# Patient Record
Sex: Male | Born: 1945 | Race: White | Hispanic: No | Smoking: Former smoker
Health system: Southern US, Community
[De-identification: ages and names within clinical notes are randomized; demographics above are authoritative.]

## PROBLEM LIST (undated history)

## (undated) DIAGNOSIS — G20A1 Parkinson's disease without dyskinesia, without mention of fluctuations: Secondary | ICD-10-CM

## (undated) DIAGNOSIS — K5790 Diverticulosis of intestine, part unspecified, without perforation or abscess without bleeding: Secondary | ICD-10-CM

## (undated) DIAGNOSIS — C61 Malignant neoplasm of prostate: Secondary | ICD-10-CM

## (undated) DIAGNOSIS — Z860100 Personal history of colon polyps, unspecified: Secondary | ICD-10-CM

## (undated) DIAGNOSIS — S83206A Unspecified tear of unspecified meniscus, current injury, right knee, initial encounter: Secondary | ICD-10-CM

## (undated) DIAGNOSIS — Z9989 Dependence on other enabling machines and devices: Secondary | ICD-10-CM

## (undated) DIAGNOSIS — E039 Hypothyroidism, unspecified: Secondary | ICD-10-CM

## (undated) DIAGNOSIS — M179 Osteoarthritis of knee, unspecified: Secondary | ICD-10-CM

## (undated) DIAGNOSIS — F4310 Post-traumatic stress disorder, unspecified: Secondary | ICD-10-CM

## (undated) DIAGNOSIS — Z22322 Carrier or suspected carrier of Methicillin resistant Staphylococcus aureus: Secondary | ICD-10-CM

## (undated) DIAGNOSIS — F32A Depression, unspecified: Secondary | ICD-10-CM

## (undated) DIAGNOSIS — G4733 Obstructive sleep apnea (adult) (pediatric): Secondary | ICD-10-CM

## (undated) DIAGNOSIS — F419 Anxiety disorder, unspecified: Secondary | ICD-10-CM

## (undated) DIAGNOSIS — F329 Major depressive disorder, single episode, unspecified: Secondary | ICD-10-CM

## (undated) DIAGNOSIS — H40003 Preglaucoma, unspecified, bilateral: Secondary | ICD-10-CM

## (undated) DIAGNOSIS — J45909 Unspecified asthma, uncomplicated: Secondary | ICD-10-CM

## (undated) DIAGNOSIS — I10 Essential (primary) hypertension: Secondary | ICD-10-CM

## (undated) DIAGNOSIS — M1710 Unilateral primary osteoarthritis, unspecified knee: Secondary | ICD-10-CM

## (undated) DIAGNOSIS — M1990 Unspecified osteoarthritis, unspecified site: Secondary | ICD-10-CM

## (undated) DIAGNOSIS — Z923 Personal history of irradiation: Secondary | ICD-10-CM

## (undated) DIAGNOSIS — G5793 Unspecified mononeuropathy of bilateral lower limbs: Secondary | ICD-10-CM

## (undated) HISTORY — PX: KNEE ARTHROSCOPY: SUR90

## (undated) HISTORY — DX: Major depressive disorder, single episode, unspecified: F32.9

## (undated) HISTORY — PX: COLONOSCOPY W/ POLYPECTOMY: SHX1380

## (undated) HISTORY — DX: Anxiety disorder, unspecified: F41.9

## (undated) HISTORY — DX: Essential (primary) hypertension: I10

## (undated) HISTORY — DX: Malignant neoplasm of prostate: C61

## (undated) HISTORY — DX: Depression, unspecified: F32.A

## (undated) HISTORY — DX: Unspecified asthma, uncomplicated: J45.909

## (undated) HISTORY — DX: Diverticulosis of intestine, part unspecified, without perforation or abscess without bleeding: K57.90

---

## 1974-10-02 HISTORY — PX: MANDIBLE RECONSTRUCTION: SHX431

## 1998-03-18 ENCOUNTER — Ambulatory Visit (HOSPITAL_COMMUNITY): Admission: RE | Admit: 1998-03-18 | Discharge: 1998-03-18 | Payer: Self-pay | Admitting: Urology

## 1998-03-24 ENCOUNTER — Ambulatory Visit (HOSPITAL_COMMUNITY): Admission: RE | Admit: 1998-03-24 | Discharge: 1998-03-24 | Payer: Self-pay | Admitting: Urology

## 1998-04-22 ENCOUNTER — Ambulatory Visit (HOSPITAL_COMMUNITY): Admission: RE | Admit: 1998-04-22 | Discharge: 1998-04-22 | Payer: Self-pay | Admitting: Urology

## 1998-07-30 ENCOUNTER — Other Ambulatory Visit: Admission: RE | Admit: 1998-07-30 | Discharge: 1998-07-30 | Payer: Self-pay | Admitting: Urology

## 1999-10-13 ENCOUNTER — Other Ambulatory Visit: Admission: RE | Admit: 1999-10-13 | Discharge: 1999-10-13 | Payer: Self-pay | Admitting: Internal Medicine

## 1999-10-13 ENCOUNTER — Encounter (INDEPENDENT_AMBULATORY_CARE_PROVIDER_SITE_OTHER): Payer: Self-pay | Admitting: Specialist

## 1999-12-09 ENCOUNTER — Ambulatory Visit: Admission: RE | Admit: 1999-12-09 | Discharge: 1999-12-09 | Payer: Self-pay | Admitting: Pulmonary Disease

## 2000-10-25 ENCOUNTER — Other Ambulatory Visit: Admission: RE | Admit: 2000-10-25 | Discharge: 2000-10-25 | Payer: Self-pay | Admitting: Otolaryngology

## 2000-10-25 ENCOUNTER — Encounter (INDEPENDENT_AMBULATORY_CARE_PROVIDER_SITE_OTHER): Payer: Self-pay | Admitting: Specialist

## 2001-03-08 ENCOUNTER — Ambulatory Visit (HOSPITAL_BASED_OUTPATIENT_CLINIC_OR_DEPARTMENT_OTHER): Admission: RE | Admit: 2001-03-08 | Discharge: 2001-03-08 | Payer: Self-pay | Admitting: Internal Medicine

## 2004-06-03 ENCOUNTER — Ambulatory Visit (HOSPITAL_COMMUNITY): Admission: RE | Admit: 2004-06-03 | Discharge: 2004-06-03 | Payer: Self-pay | Admitting: Orthopedic Surgery

## 2004-06-03 ENCOUNTER — Ambulatory Visit (HOSPITAL_BASED_OUTPATIENT_CLINIC_OR_DEPARTMENT_OTHER): Admission: RE | Admit: 2004-06-03 | Discharge: 2004-06-03 | Payer: Self-pay | Admitting: Orthopedic Surgery

## 2005-09-28 ENCOUNTER — Ambulatory Visit: Payer: Self-pay | Admitting: Internal Medicine

## 2005-10-13 ENCOUNTER — Ambulatory Visit: Payer: Self-pay | Admitting: Internal Medicine

## 2005-11-09 ENCOUNTER — Ambulatory Visit: Payer: Self-pay | Admitting: Internal Medicine

## 2005-11-15 ENCOUNTER — Ambulatory Visit: Payer: Self-pay | Admitting: Internal Medicine

## 2005-12-18 ENCOUNTER — Ambulatory Visit: Payer: Self-pay | Admitting: Internal Medicine

## 2006-07-02 ENCOUNTER — Ambulatory Visit (HOSPITAL_BASED_OUTPATIENT_CLINIC_OR_DEPARTMENT_OTHER): Admission: RE | Admit: 2006-07-02 | Discharge: 2006-07-02 | Payer: Self-pay | Admitting: Orthopedic Surgery

## 2006-07-13 ENCOUNTER — Ambulatory Visit: Payer: Self-pay | Admitting: Internal Medicine

## 2006-11-12 ENCOUNTER — Inpatient Hospital Stay (HOSPITAL_COMMUNITY): Admission: RE | Admit: 2006-11-12 | Discharge: 2006-11-15 | Payer: Self-pay | Admitting: Orthopedic Surgery

## 2006-11-12 HISTORY — PX: TOTAL KNEE ARTHROPLASTY: SHX125

## 2007-02-05 ENCOUNTER — Ambulatory Visit (HOSPITAL_BASED_OUTPATIENT_CLINIC_OR_DEPARTMENT_OTHER): Admission: RE | Admit: 2007-02-05 | Discharge: 2007-02-05 | Payer: Self-pay | Admitting: Orthopedic Surgery

## 2007-03-01 ENCOUNTER — Ambulatory Visit: Payer: Self-pay | Admitting: Internal Medicine

## 2007-03-19 ENCOUNTER — Ambulatory Visit: Payer: Self-pay | Admitting: Internal Medicine

## 2007-03-19 ENCOUNTER — Encounter: Payer: Self-pay | Admitting: Internal Medicine

## 2007-05-28 ENCOUNTER — Encounter: Payer: Self-pay | Admitting: Internal Medicine

## 2007-05-28 DIAGNOSIS — J449 Chronic obstructive pulmonary disease, unspecified: Secondary | ICD-10-CM | POA: Insufficient documentation

## 2007-05-28 DIAGNOSIS — I10 Essential (primary) hypertension: Secondary | ICD-10-CM | POA: Insufficient documentation

## 2007-05-28 DIAGNOSIS — J4489 Other specified chronic obstructive pulmonary disease: Secondary | ICD-10-CM | POA: Insufficient documentation

## 2007-10-03 HISTORY — PX: HAND SURGERY: SHX662

## 2010-07-29 ENCOUNTER — Ambulatory Visit: Payer: Self-pay | Admitting: Emergency Medicine

## 2010-07-29 ENCOUNTER — Encounter: Payer: Self-pay | Admitting: Emergency Medicine

## 2010-07-29 DIAGNOSIS — J45909 Unspecified asthma, uncomplicated: Secondary | ICD-10-CM | POA: Insufficient documentation

## 2010-07-29 DIAGNOSIS — E039 Hypothyroidism, unspecified: Secondary | ICD-10-CM | POA: Insufficient documentation

## 2010-09-16 ENCOUNTER — Ambulatory Visit: Payer: Self-pay | Admitting: Emergency Medicine

## 2010-10-18 ENCOUNTER — Ambulatory Visit: Admit: 2010-10-18 | Payer: Self-pay | Admitting: Emergency Medicine

## 2010-11-01 NOTE — Assessment & Plan Note (Signed)
Summary: COPD   Visit Type:  Initial Consult Copy to:  self referral Primary Provider/Referring Provider:  [facility] [place]  CC:  Dyspnea.  History of Present Illness: 65 yo former smoker, hx of HTN, hypothyroidism, childhood asthma. Has been seen before by PW for dyspnea and presumed COPD. PFT' s done in the 90's. Also w hx allergic rhinitis.   He complains of slow progressive dyspnea, wheeze, congestion in his chest beginning in August. Was well until then. Was seen by Urgent Care and treated for AE/bronchitis with steroids + abx. He didn't feel much relief, was seen back there 4 separate times. Now he has continued DOE, cough is at his usual baseline at this time. Uses albuterol as needed, lately using 2 -3x a day. Not on any scheduled meds, believes he was on combivent, ? other long-acting meds in the past.   Current Medications (verified): 1)  Albuterol 90 Mcg/act  Aers (Albuterol) .... 2 Puffs As Needed 2)  Synthroid 88 Mcg Tabs (Levothyroxine Sodium) .... Take 1 Tablet By Mouth Once A Day  Allergies (verified): No Known Drug Allergies  Past History:  Past Medical History: HYPOTHYROIDISM (ICD-244.9) ASTHMA (ICD-493.90) HYPERTENSION (ICD-401.9) COPD (ICD-496)  Family History: mother - asthma  Social History: Former smoker.  Quit in 1998.  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  The patient denies shortness of breath at rest, productive cough, coughing up blood, chest pain, irregular heartbeats, acid heartburn, indigestion, weight change, abdominal pain, difficulty swallowing, sore throat, tooth/dental problems, headaches, nasal congestion/difficulty breathing through nose, sneezing, itching, ear ache, anxiety, depression, hand/feet swelling, joint stiffness or pain, rash, change in color of mucus, and fever.         Overall wt in last 10  yrs - lost about 15 lbs.   Vital Signs:  Patient profile:   65 year old male Height:      77 inches Weight:      284.50 pounds BMI:     33.86 O2 Sat:      98 % on Room air Temp:     98.3 degrees F oral Pulse rate:   59 / minute BP sitting:   168 / 98  (right arm) Cuff size:   large  Vitals Entered By: Gweneth Dimitri RN (July 29, 2010 1:51 PM)  O2 Flow:  Room air  Serial Vital Signs/Assessments:  Comments: 2:53 PM Ambulatory Pulse Oximetry  Resting; HR_55____    02 Sat__96% RA___  Lap1 (185 feet)   HR_77____   02 Sat_97% RA____ Lap2 (185 feet)   HR_75____   02 Sat__96% RA___    Lap3 (185 feet)   HR_79____   02 Sat_95% RA____  _x__Test Completed without Difficulty ___Test Stopped due to:  Gweneth Dimitri RN  July 29, 2010 2:53 PM  By: Gweneth Dimitri RN   CC: Dyspnea Comments Medications reviewed with patient Daytime contact number verified with patient. Gweneth Dimitri RN  July 29, 2010 1:52 PM    Physical Exam  General:  normal appearance, healthy appearing, and obese.   Head:  normocephalic and atraumatic Eyes:  conjunctiva and sclera clear Nose:  no deformity, discharge, inflammation, or lesions Mouth:  no deformity or lesions Neck:  no masses, thyromegaly, or abnormal cervical nodes Lungs:  clear bilaterally to auscultation on normal respiration, mild wheeze on forced exp Heart:  regular  rate and rhythm, S1, S2 without murmurs, rubs, gallops, or clicks Abdomen:  not examined Msk:  no deformity or scoliosis noted with normal posture Extremities:  no clubbing, cyanosis, edema, or deformity noted Neurologic:  non-focal Skin:  intact without lesions or rashes Psych:  alert and cooperative; normal mood and affect; normal attention span and concentration   Impression & Recommendations:  Problem # 1:  COPD (ICD-496) Presumed COPD presenting w continued SOB after rx for actue exacerbation. - spiro today and get copies of priors (old chart) - walking  oximetry - start spiriva once daily + as needed SABA - ROV to review progress in mid December.   Medications Added to Medication List This Visit: 1)  Synthroid 88 Mcg Tabs (Levothyroxine sodium) .... Take 1 tablet by mouth once a day  Other Orders: Consultation Level IV (45409)  Patient Instructions: 1)  Walking oximetry today showed normal oxygen levels. 2)  Spirometry today showed mild COPD changes 3)  We will start Spiriva once daily until your next visit 4)  Use your albuterol 2 puffs as needed  5)  Follow up with Dr Delton Coombes in Mid-December to report your progress.    Immunization History:  Influenza Immunization History:    Influenza:  historical (07/02/2010)

## 2010-11-01 NOTE — Miscellaneous (Signed)
Summary: Orders Update  Medications Added SPIRIVA HANDIHALER 18 MCG CAPS (TIOTROPIUM BROMIDE MONOHYDRATE) 1 by mouth once daily       Clinical Lists Changes  Medications: Added new medication of SPIRIVA HANDIHALER 18 MCG CAPS (TIOTROPIUM BROMIDE MONOHYDRATE) 1 by mouth once daily - Signed Rx of SPIRIVA HANDIHALER 18 MCG CAPS (TIOTROPIUM BROMIDE MONOHYDRATE) 1 by mouth once daily;  #30 x 2;  Signed;  Entered by: Leslye Peer MD;  Authorized by: Leslye Peer MD;  Method used: Electronically to [facility]  [address] [phone]*, [address] [place], [place], [place]  [address], Ph: [phone] or [phone], Fax: [phone]    Prescriptions: SPIRIVA HANDIHALER 18 MCG CAPS (TIOTROPIUM BROMIDE MONOHYDRATE) 1 by mouth once daily  #30 x 2   Entered and Authorized by:   Leslye Peer MD   Signed by:   Leslye Peer MD on 07/29/2010   Method used:   Electronically to        [facility]  [address] [phone]* (retail)       [address]       [place], [place]  [address]       Ph: [phone] or [phone]       Fax: [phone]   RxID:   [id number]

## 2010-11-17 ENCOUNTER — Ambulatory Visit: Payer: Self-pay | Admitting: Emergency Medicine

## 2011-02-17 NOTE — Op Note (Signed)
NAME:  Jerry Mathews, WAH NO.:  [id number]   MEDICAL RECORD NO.:  [id number]          PATIENT TYPE:  AMB   LOCATION:  DSC                          FACILITY:  [facility]   PHYSICIAN:  Feliberto Gottron. Turner Daniels, M.D.   DATE OF BIRTH:  04/22/1946   DATE OF PROCEDURE:  02/05/2007  DATE OF DISCHARGE:                               OPERATIVE REPORT   PREOPERATIVE DIAGNOSIS:  Arthrofibrosis left total knee.   POSTOPERATIVE DIAGNOSIS:  Arthrofibrosis left total knee.   PROCEDURE:  Closed manipulation left total knee, getting flexion from 85  degrees to almost 120.   SURGEON:  Feliberto Gottron. Turner Daniels, M.D.   FIRST ASSISTANT:  None.   ANESTHETIC:  General mask.   ESTIMATED BLOOD LOSS:  None.   FLUID REPLACEMENT:  500 cc crystalloid.   We also injected the knee with 10 cc of 0.5% Marcaine with epinephrine  solution just before we started the manipulation.   INDICATIONS FOR PROCEDURE:  This 65 year old man underwent a right total  knee arthroplasty 2 months ago.  He has worked hard in physical therapy,  but cannot flex past about 80-85 degrees and desires elective closed  manipulation of the knee to work on motion.  Risks and benefits of  surgery were discussed.  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 Risks and  benefits of surgery were discussed.  Questions answered.   DESCRIPTION OF PROCEDURE:  The patient was identified by armband and  taken the operating room at [facility].  Appropriate  anesthetic monitors were attached, and general mask anesthesia induced  with the patient in the gurney. After this had been accomplished, I went  and sterilely prepped his knee and, using a medial parapatellar  approach, injected him with  10 cc of 0.5% percent Marcaine and  epinephrine solution, and then simply flexed the hip and the knee so the  gravity would help flex the knee for the first 30 seconds or so. This  got him to  about 85 or 90 degrees, and then flexing the hip and the knee  and applying a downward pressure at the middle of the tibia, and  supporting posterior to the knee to prevent any fractures, I went ahead  and gently applied pressure over a 3-minute period and got him to 120  degrees of flexion and made a photographic documentation of this.  Several  small pops or cracks were heard as the scar tissue at loose.  Satisfied  with the closed manipulation, I then brought him to full extension.  There was no effusion noted in the immediate postoperative period.  An  ice pack was applied to the knee.  The patient was then awakened and  taken to the recovery room without difficulty.      Feliberto Gottron. Turner Daniels, M.D.  Electronically Signed     FJR/MEDQ  D:  02/05/2007  T:  02/05/2007  Job:  [id number]

## 2011-02-17 NOTE — Op Note (Signed)
NAME:  [name], Jerry Mathews NO.:  [id number]   MEDICAL RECORD NO.:  [id number]          PATIENT TYPE:  AMB   LOCATION:  DSC                          FACILITY:  [facility]   PHYSICIAN:  Feliberto Gottron. Turner Daniels, M.D.   DATE OF BIRTH:  November 03, 1945   DATE OF PROCEDURE:  07/12/2006  DATE OF DISCHARGE:  07/02/2006                                 OPERATIVE REPORT   PREOPERATIVE DIAGNOSES:  1. Left knee lateral meniscal tear.  2. 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   POSTOPERATIVE DIAGNOSES:  1. Left knee lateral meniscal tear.  2. Chondromalacia of the lateral femoral condyle, global grade 3, focal      grade 4; lateral tibial condyle, focal grade 4, global grade 3 with      flap tears.   PROCEDURES:  1. Left knee arthroscopic lateral meniscectomy.  2. Debridement of chondromalacia from the lateral compartment as well as      the trochlear compartment, where there was also grade 3 chondromalacia      with flap tears.   SURGEON:  Feliberto Gottron. Turner Daniels, M.D.   FIRST ASSISTANT:  None.   ANESTHETIC:  General LMA.   ESTIMATED BLOOD LOSS:  Minimal.   FLUID REPLACEMENT:  800 mL crystalloid.   DRAINS PLACED:  None.   TOURNIQUET TIME:  None.   INDICATIONS FOR PROCEDURE:  The patient is a 65 year old gentleman who has  been treated by me in the past under Workers' Compensation for cartilage  tears and chondromalacia of his left knee.  His symptoms have recently  gotten much worse.  He has failed conservative treatment with a cortisone  injection and desires elective arthroscopic evaluation and treatment of his  left knee.  Risks and benefits of surgery are well-known to the patient.   DESCRIPTION OF PROCEDURE:  The patient identified by armband, taken the  operating room at [facility].  Appropriate anesthetic monitors  were attached and general LMA anesthesia induced with the patient in supine  position.  A lateral post was applied to the table  and the left lower  extremity prepped and draped in the usual sterile fashion from the ankle to  the midthigh.  Using a #11 blade, standard inferomedial and inferolateral  peripatellar portals were then made allowing introduction of the arthroscope  through the inferolateral portal and the outflow through the inferomedial  portal.  Diagnostic arthroscopy revealed grade 3 chondromalacia of the  trochlea, which was debrided back to stable margin with a 3.5 gator sucker  shaver, and there was also some incidental chondromalacia of the patella  noted and this was debrided back to a stable margin as well.  The patient  had complex tearing of the lateral meniscus requiring pretty much a complete  lateral meniscectomy, leaving behind maybe 20% of the rim.  Regarding the  articular cartilage of the lateral tibial plateau, there was a fairly large  area of grade 4 bare bone changes with peripheral grade 3 chondromalacia,  less so on the femoral side.  These areas were debrided back to a  stable  margin.  The gutters were cleared medially and laterally and the knee  irrigated out with normal saline solution.  At this point the arthroscopic  instruments were removed and a dressing of Xeroform, 4x4 dressing sponges,  Webril and Ace wrap applied.  The patient was then awakened and taken to the  recovery room without difficulty.      Feliberto Gottron. Turner Daniels, M.D.  Electronically Signed     FJR/MEDQ  D:  07/12/2006  T:  07/14/2006  Job:  [id number]

## 2011-02-17 NOTE — Op Note (Signed)
NAME:  Jerry Mathews, Jerry Mathews                          ACCOUNT NO.:  [id number]   MEDICAL RECORD NO.:  [id number]                   PATIENT TYPE:  AMB   LOCATION:  DSC                                  FACILITY:  [facility]   PHYSICIAN:  Feliberto Gottron. Turner Daniels, M.D.                DATE OF BIRTH:  [birth year]   DATE OF PROCEDURE:  DATE OF DISCHARGE:                                 OPERATIVE REPORT   DATE OF SURGERY:  June 03, 2004.   PREOPERATIVE DIAGNOSES:  Left knee medial and lateral meniscal tears as well  as ACL tear.   POSTOPERATIVE DIAGNOSES:  Left knee posterior horn medial meniscus tear,  fairly global lateral meniscal tear, partial ACL tear, chondromalacia  trochlea, grade 3, with flap tears, chondromalacia medial femoral condyle  with flap tears, and chondromalacia of the lateral tibial plateau, focal  grade 4, global grade 3, with flap tears.   PROCEDURE:  Left knee arthroscopic debridement of chondromalacia and  meniscal tears as well as partial ACL tear of the lateral bundle.   SURGEON:  Feliberto Gottron. Turner Daniels, MD.   FIRST ASSISTANT:  None.   ANESTHESIA:  General LMA.   ESTIMATED BLOOD LOSS:  Minimal.   FLUID REPLACEMENT:  1400 mL of crystalloid.   DRAINS PLACED:  None.   TOURNIQUET TIME:  None.   INDICATIONS FOR PROCEDURE:  A 65 year old man injured at work with MRI-  proven meniscal tears and what looks like at least a partial ACL tear.  Because of this, he is prepared for surgical intervention for catching,  popping, and pain.  He has had previous arthroscopic surgery in that knee  that has benefited him in the past.   DESCRIPTION OF PROCEDURE:  Patient identified by arm band, taken to the  operating room at [facility], appropriate anesthetic monitors  were attached, and general endotracheal anesthesia induced with the patient  in the supine position.  A lateral post applied to the table, and the left  lower extremity prepped and draped in the usual sterile fashion  from the  ankle to the mid thigh.  Using a #11 blade, standard inferomedial and  inferolateral peripatellar portals were then made allowing introduction of  the arthroscope through the inferolateral portal and the outflow through the  inferomedial portal.  Mild chondromalacia, grade 2, of the patella was  noted, grade 3 chondromalacia with flap tears of the trochlea, grade 1 x 1.5-  cm area was noted and treated with a 3.5 gator sucker shaver.  Moving into  the medial compartment, we encountered a posterior horn horizontal split  tear and parrot-beak tears of the posterior horn of the medial meniscus, and  these were debrided back to stable margins with a 3.5 gator sucker shaver as  well as with the straight biter.  Moving into the notch, the distal lateral  bundle of the ACL insertion was noted to be  torn and freely moving in the  notch.  This was debrided back to a stable margin with 3.5 and 4.2 sucker  shavers.  The lateral meniscus had multiple complex tears requiring  debridement with straight biters as well as a 4.2 great white sucker shaver  and a 3.5 gator sucker shaver.  The posterior aspect of the lateral tibial  plateau had bare bone over a 1 x 1.5-cm area with flap tears that was also  debrided.  The gutters were cleared.  The scope was taken medial and lateral  to the PCL posterior compartment and some chondromalacia of the medial  femoral condyle distally, global grade 3, was also debrided.  At this point,  the knee was irrigated out with normal saline solution, the arthroscopic  instruments removed, local anesthetic of half-percent Marcaine and  epinephrine solution was then infiltrated into the portals, and during the  procedure a supplemental farther medial portal was made for debridement of  the posterior horn of the medial meniscus.  A dressing of Xeroform, 4x4  dressing, sponges, Webril, and an Ace wrap applied.  The total amount of  local anesthetic used was about 20 mL  of half-percent Marcaine with  epinephrine solution.  The patient was then awakened and taken to the  recovery room without difficulty.                                               Feliberto Gottron. Turner Daniels, M.D.    Ovid Curd  D:  06/03/2004  T:  06/05/2004  Job:  [id number]   cc:   Attention, Warden/ranger, Workers [employer]

## 2011-02-17 NOTE — Op Note (Signed)
NAME:  Jerry Mathews, Jerry Mathews NO.:  [id number]   MEDICAL RECORD NO.:  [id number]          PATIENT TYPE:  INP   LOCATION:  2550                         FACILITY:  [facility]   PHYSICIAN:  Feliberto Gottron. Turner Daniels, M.D.   DATE OF BIRTH:  Aug 04, 1946   DATE OF PROCEDURE:  11/12/2006  DATE OF DISCHARGE:                               OPERATIVE REPORT   PREOPERATIVE DIAGNOSIS:  End stage arthritis left knee.   POSTOPERATIVE DIAGNOSIS:  End stage arthritis left knee.   PROCEDURE:  Left total knee arthroplasty using DePuy Sigma RP  components, all cemented, 6 femur, 6 tibia, 10 Sigma RP spacer 41 mm  button, double batch of DePuy HV cement with 1500 mg of Zinacef.   SURGEON:  Feliberto Gottron. Turner Daniels, M.D.   FIRST ASSISTANT:  Skip Mayer, [facility].   ANESTHETIC:  General endotracheal.   ESTIMATED BLOOD LOSS:  Minimal.   FLUID REPLACEMENT:  Liter of crystalloid.   TOURNIQUET TIME:  1 hour 45 minutes.   INDICATIONS FOR PROCEDURE:  A 65 year old gentleman with end-stage  arthritis of the left knee documented at arthroscopy who has failed  conservative treatment with arthroscopy anti-inflammatory medicines,  physical therapy, cortisone injections.  Has bare bone changes by  arthroscopy and now desires elective left total knee arthroplasty.  Risks and benefits of surgery discussed, questions answered.  His pain  is bad now where it affects his ability  to do his job and he is overall  miserable.   DESCRIPTION OF PROCEDURE:  The patient identified by arm band and taken  to the operating room at [facility] where appropriate site  monitors were attached and general endotracheal anesthesia induced with  the patient in supine position.  A tourniquet was then applied high to  the left thigh, lateral post and foot positioner applied to the table  and the patient received 1 gram of Ancef preoperatively.  The left lower  extremity was then prepped and draped in usual sterile fashion from the  ankle  to the mid thigh.  The limb wrapped with an Esmarch bandage with  the knee flexed.  Tourniquet inflated to 350 mmHg and we began the  procedure by making an anterior midline incision allowing a medial  parapatellar arthrotomy approach to the knee.  Small bleeders were  identified and cauterized with Bovie.  After the parapatellar arthrotomy  was accomplished, the patella was everted and the prepatellar fat pad  resected.  The superficial medial collateral ligament was then elevated  from anterior-posterior leaving the sleeve intact on the tibia.  The  knee was then hyperflexed with the patellae everted exposing fairly  impressive osteophytes, the medial and greater on the lateral side of  the femur and the patella.  These were removed with rongeurs and  osteotomes, especially notch osteophytes.  This allowed resection of the  PCL and the ACL.  We then used the posterior medial Z retractor and  McHale retractor through the notch and a lateral Homan to expose the  proximal tibia which was entered with the DePuy step drill followed by  the intramedullary  rod and a 0 degrees posterior slope cutting guide.  This allowed resection of about a centimeter of bone medially and about  4 to 5 mm of bone laterally where most the cartilage deficiency was.  Satisfied with the proximal tibia resection, we then entered the distal  femur 2 mm anterior to the PCL origin with a step drill followed by  intramedullary cutting guide and a 5 degree left distal femoral cutting  guide set at 11 mm.  This and along the epicondylar axis and then a  distal femoral cut accomplished.  We then measured for a #6 femoral  component using the posterior referencing femoral sizing guide and  placed the pins in neutral rotation.  The 6 chamfer cutting guide was  then brought up hammered into place and the anterior-posterior and  chamfer cuts accomplished followed by the box cut.  The everted patella  was then measured at 26  mm, it was thought to fit a 41 mm button so we  took 11 off of a 26 and set the cutting guide at 15 and performed our  posterior tibial cut, sized for a 41 trial button and drilled the  patella. At this point the knee was once again hyperflexed.  We sized  for a 6 tibial base plate which was pinned into place.  The smoke stack  was applied, the conical reamer was taken down to smoke stack and then  the DeltaFit keel was hammered into place.  We then hammered into place  a 6 left femoral trial, snapped into place a 10 mm Sigma RP trial spacer  reduced the patella and took the knee through a range of motion.  The  patella tract laterally and a lateral release was performed with  electrocautery without difficulty.  We also took out some more  peripheral osteophytes from the lateral side of the patella.  At this  point all the trial components removed.  After first drilling the lugs  for the femur, all bony surfaces were water picked clean and dried with  suction and sponges.  At the back table, a double patch of DePuy HV  cement 1500 mg of Zinacef was mixed and applied to all bony and metallic  meeting surfaces except for the posterior condyles of the femur.  In  order, we then hammered into place a 6 tibial base plate and removed  excess cement, a 6 a left femoral component and removed excess cement,  snapped in the 10 Sigma RP spacer and reduced the knee.  The  parapatellar component was then clamped onto the patella and excess  cement removed and all cement was allowed to cure.  Medium Hemovac  drains placed in the wound.  The knee was taken to range of motion to  confirmed good patellar tracking.  The wound was then irrigated out with  normal saline solution, pulse lavage.  The peripatellar arthrotomy  closed with running #1 Vicryl suture, subcutaneous tissue with 0 and 2-0  undyed Vicryl suture and skin with skin staples.  A dressing Xeroform 4x4 dressing sponges, Webril and Ace wrap  applied, then the tourniquet  was let down.  The patient was awakened and taken to recovery room  without difficulty.      Feliberto Gottron. Turner Daniels, M.D.  Electronically Signed     FJR/MEDQ  D:  11/12/2006  T:  11/12/2006  Job:  [id number]

## 2011-02-17 NOTE — Discharge Summary (Signed)
NAME:  Jerry Mathews, DAMIANO NO.:  [id number]   MEDICAL RECORD NO.:  [id number]          PATIENT TYPE:  INP   LOCATION:  5014                         FACILITY:  [facility]   PHYSICIAN:  Feliberto Gottron. Turner Daniels, M.D.   DATE OF BIRTH:  1945/12/16   DATE OF ADMISSION:  11/12/2006  DATE OF DISCHARGE:  11/15/2006                               DISCHARGE SUMMARY   PRIMARY DIAGNOSIS:  End-stage degenerative joint disease of the left  knee.   PROCEDURE WHILE IN HOSPITAL:  Left total knee arthroplasty.   HISTORY OF PRESENT ILLNESS:  Patient is a 65 year old gentleman with end-  stage arthritis of the left knee documented by arthroscopy who has  failed conservative treatment post arthroscopy, including  antiinflammatory medication and physical therapy, cortisone injections  because of the bone-on-bone change by arthroscopy and the continued pain  that is affecting activities of daily living and sleeping.  Now desires  elective left total knee arthroplasty.  Risks and benefits are  discussed.  Questions are answered and wishes to proceed.   ALLERGIES:  No known drug allergies.   MEDICATIONS AT TIME OF ADMISSION:  1. Atacand.  2. Pravastatin.  3. Albuterol.   PAST MEDICAL HISTORY:  Childhood disease.   ADULT HISTORY:  1. Hypertension.  2. Asthma.  3. Elevated cholesterol.   SURGICAL HISTORY:  1. Knees scopes in 2005 and 2007.  2. Jaw surgery in 1976.  3. No difficulties with GET.   SOCIAL HISTORY:  No tobacco.  No IV drug abuse.  Positive ethanol 1-2  beers per month.  He is married.  He is employed as a Naval architect.   FAMILY HISTORY:  Mother died at age 8 with a history of CD, DVT,  hypertension and diabetes.  Father died at age 11 with a history of MI.   REVIEW OF SYSTEMS:  Positive for glasses.  Morning cough and bronchitis.  Denies any chest pain.   PHYSICAL EXAMINATION:  VITAL SIGNS:  Temperature 97.8.  Pulse 64.  Respirations 18.  Blood pressure 128/88.  He is a 6  foot 5 inch 289  pound male.  EXAMINATION OF HEAD:  Shows no trauma.  Pupils equal active to light and  accommodation.  EARS:  TMs are clear.  NOSE:  Is patent.  THROAT:  Benign.  NECK:  Supple.  Full range of motion.  MOUTH:  Does show partial dentures.  CHEST:  Clear to auscultation and percussion.  HEART:  Regular rate and rhythm.  ABDOMEN:  Soft, nontender.  No masses.  Bowel sounds 2+.  EXTREMITIES:  Left knee range of motion with 5-9 degrees with 5+ valgus  deformity.  Well-healed normal scars from post arthroscopy.   X-rays done show bone-on-bone changes of the left knee.   PREOPERATIVE LABS:  Including CBC, CMET, chest x-ray, EKG, PT and PTT  are all within normal limits.  Exception of EKG which did show some left  axis deviation.   HOSPITAL COURSE:  On the day of admission the patient was taken to the  operating room at [facility] where he underwent a total  knee  arthroplasty using diffuse Sigma RP components all submitted #6 femur,  #6 tibia, 10 mm Sigma RP spacer with a 41-mm patellar button, double  batch of DePuy HV cement with 1500 mg of Zinacef embedded.  Medium  Hemovac was placed double-armed into the wound.  Patient was placed  perioperative antibiotics.  He is placed on postoperative Coumadin  prophylaxis with bridging Lovenox therapy.  He is placed on  postoperative PC with Dilaudid for pain control and Foley was placed  perioperatively.  Physical therapy was begun in the recovery room using  a CPM to regain early motion.   On Postop day 1, the patient was awake, alert, in moderate pain.  No  nausea or vomiting.  Taking p.o.'s well.  Vital signs are stable.  Wound  is clean and dry.  Hemoglobin 11.5.  WBC 9.3.  PT 14.7.  Urine output  250 mL per shift.  Hemovac was discontinued without difficulty.  Postoperative day 2 patient was reporting moderate pain.  T-max of 102.2  range and 99.4.  Hemoglobin 11.7.  WBC 10.7.  INR 1.2.  Dressing with  dry blood at  drain site.  Wound is otherwise benign.  Cap was soft and  nontender.  Patient did have some scattered wheezes in his lungs.  X-  rays were taken which showed no acute disease.  Physical therapy  continued.  Postoperative day 3 patient was afebrile.  Vital signs were  stable.  Wound was benign.  Perfusion was improving.  Hemoglobin 10.8.  WBC 9.8.  INR 1.3 and it was hoping that he would be discharged the  following day.  Because of the patient's rapid progress and physical  therapy that day; however, he was discharged home on the third  postoperative day to the care of his family.   DISCHARGE MEDICATIONS:  1. At the time of his discharge medications included Percocet for pain      control.  2. Coumadin per pharmacy protocol with a target INR of 1.52 which      would be monitored by [facility] for the following 2 weeks      postoperatively.  3. Robaxin as needed for spasm.  4. He will restart his home medications including Atacand.  5. Pravastatin.  6. Albuterol.   ACTIVITY:  Weightbearing as tolerated with total knee precautions and  walker.  CMP for home use.  He will have home health nursing, home  health PT through [facility].   DIET:  Regular.   WOUND CARE:  Dressing changes daily or as needed.   FOLLOWUP:  Return to clinic in approximately 1 weeks times.  Sooner if  he should have any temperature, increase in the pain or drainage from  the wound.  At the time of his discharge he was medically stable and  orthopedically improved.   PROCEDURE WHILE IN HOSPITAL:  Left total knee arthroplasty.      Laural Benes. Jannet Mantis.      Feliberto Gottron. Turner Daniels, M.D.  Electronically Signed    JBR/MEDQ  D:  01/14/2007  T:  01/14/2007  Job:  [id number]

## 2011-04-25 ENCOUNTER — Encounter (HOSPITAL_COMMUNITY)
Admission: RE | Admit: 2011-04-25 | Discharge: 2011-04-25 | Disposition: A | Discharge status: Home / Self Care | Payer: Worker's Compensation | Source: Ambulatory Visit | Attending: Orthopedic Surgery | Admitting: Orthopedic Surgery

## 2011-04-25 ENCOUNTER — Ambulatory Visit (HOSPITAL_COMMUNITY)
Admission: RE | Admit: 2011-04-25 | Discharge: 2011-04-25 | Disposition: A | Discharge status: Home / Self Care | Payer: Worker's Compensation | Source: Ambulatory Visit | Attending: Orthopedic Surgery | Admitting: Orthopedic Surgery

## 2011-04-25 ENCOUNTER — Other Ambulatory Visit (HOSPITAL_COMMUNITY): Payer: Self-pay | Admitting: Orthopedic Surgery

## 2011-04-25 DIAGNOSIS — Z01818 Encounter for other preprocedural examination: Secondary | ICD-10-CM | POA: Insufficient documentation

## 2011-04-25 DIAGNOSIS — M25569 Pain in unspecified knee: Secondary | ICD-10-CM

## 2011-04-25 DIAGNOSIS — J4489 Other specified chronic obstructive pulmonary disease: Secondary | ICD-10-CM | POA: Insufficient documentation

## 2011-04-25 DIAGNOSIS — Z01812 Encounter for preprocedural laboratory examination: Secondary | ICD-10-CM | POA: Insufficient documentation

## 2011-04-25 DIAGNOSIS — J449 Chronic obstructive pulmonary disease, unspecified: Secondary | ICD-10-CM | POA: Insufficient documentation

## 2011-04-25 DIAGNOSIS — I517 Cardiomegaly: Secondary | ICD-10-CM | POA: Insufficient documentation

## 2011-04-25 LAB — BASIC METABOLIC PANEL
BUN: 13 mg/dL (ref 6–23)
CO2: 31 mEq/L (ref 19–32)
Calcium: 9.9 mg/dL (ref 8.4–10.5)
Chloride: 103 mEq/L (ref 96–112)
Creatinine, Ser: 0.82 mg/dL (ref 0.50–1.35)
GFR calc Af Amer: >60 mL/min (ref >60)
GFR calc non Af Amer: >60 mL/min (ref >60)
Glucose, Bld: 96 mg/dL (ref 70–99)
Potassium: 4.4 mEq/L (ref 3.5–5.1)
Sodium: 141 mEq/L (ref 135–145)

## 2011-04-25 LAB — TYPE AND SCREEN
ABO/RH(D): AB POS
Antibody Screen: NEGATIVE

## 2011-04-25 LAB — DIFFERENTIAL
Basophils Absolute: 0 10*3/uL (ref 0.0–0.1)
Basophils Relative: 0 % (ref 0–1)
Eosinophils Absolute: 0.1 10*3/uL (ref 0.0–0.7)
Eosinophils Relative: 2 % (ref 0–5)
Lymphocytes Relative: 35 % (ref 12–46)
Lymphs Abs: 1.6 10*3/uL (ref 0.7–4.0)
Monocytes Absolute: 0.6 10*3/uL (ref 0.1–1.0)
Monocytes Relative: 13 % — ABNORMAL HIGH (ref 3–12)
Neutro Abs: 2.3 10*3/uL (ref 1.7–7.7)
Neutrophils Relative %: 49 % (ref 43–77)

## 2011-04-25 LAB — CBC
HCT: 44.6 % (ref 39.0–52.0)
Hemoglobin: 15.7 g/dL (ref 13.0–17.0)
MCH: 31.4 pg (ref 26.0–34.0)
MCHC: 35.2 g/dL (ref 30.0–36.0)
MCV: 89.2 fL (ref 78.0–100.0)
Platelets: 219 10*3/uL (ref 150–400)
RBC: 5 MIL/uL (ref 4.22–5.81)
RDW: 13.2 % (ref 11.5–15.5)
WBC: 4.7 10*3/uL (ref 4.0–10.5)

## 2011-04-25 LAB — URINALYSIS, ROUTINE W REFLEX MICROSCOPIC
Bilirubin Urine: NEGATIVE
Glucose, UA: NEGATIVE mg/dL
Hgb urine dipstick: NEGATIVE
Ketones, ur: NEGATIVE mg/dL
Leukocytes, UA: NEGATIVE
Nitrite: NEGATIVE
Protein, ur: NEGATIVE mg/dL
Specific Gravity, Urine: 1.017 (ref 1.005–1.030)
Urobilinogen, UA: 0.2 mg/dL (ref 0.0–1.0)
pH: 6.5 (ref 5.0–8.0)

## 2011-04-25 LAB — SURGICAL PCR SCREEN
MRSA, PCR: NEGATIVE
Staphylococcus aureus: POSITIVE — AB

## 2011-04-25 LAB — PROTIME-INR
INR: 0.92 (ref 0.00–1.49)
Prothrombin Time: 12.6 seconds (ref 11.6–15.2)

## 2011-04-25 LAB — APTT: aPTT: 30 seconds (ref 24–37)

## 2011-05-01 ENCOUNTER — Inpatient Hospital Stay (HOSPITAL_COMMUNITY)
Admission: RE | Admit: 2011-05-01 | Discharge: 2011-05-03 | DRG: 489 | Disposition: A | Discharge status: Home / Self Care | Payer: Worker's Compensation | Source: Ambulatory Visit | Attending: Orthopedic Surgery | Admitting: Orthopedic Surgery

## 2011-05-01 DIAGNOSIS — Z01818 Encounter for other preprocedural examination: Secondary | ICD-10-CM

## 2011-05-01 DIAGNOSIS — Z96659 Presence of unspecified artificial knee joint: Secondary | ICD-10-CM

## 2011-05-01 DIAGNOSIS — J45909 Unspecified asthma, uncomplicated: Secondary | ICD-10-CM | POA: Diagnosis present

## 2011-05-01 DIAGNOSIS — E039 Hypothyroidism, unspecified: Secondary | ICD-10-CM | POA: Diagnosis present

## 2011-05-01 DIAGNOSIS — Z87891 Personal history of nicotine dependence: Secondary | ICD-10-CM

## 2011-05-01 DIAGNOSIS — Y831 Surgical operation with implant of artificial internal device as the cause of abnormal reaction of the patient, or of later complication, without mention of misadventure at the time of the procedure: Secondary | ICD-10-CM | POA: Diagnosis present

## 2011-05-01 DIAGNOSIS — Z01812 Encounter for preprocedural laboratory examination: Secondary | ICD-10-CM

## 2011-05-01 DIAGNOSIS — T84099A Other mechanical complication of unspecified internal joint prosthesis, initial encounter: Principal | ICD-10-CM | POA: Diagnosis present

## 2011-05-01 DIAGNOSIS — I10 Essential (primary) hypertension: Secondary | ICD-10-CM | POA: Diagnosis present

## 2011-05-01 HISTORY — PX: OTHER SURGICAL HISTORY: SHX169

## 2011-05-02 LAB — PROTIME-INR
INR: 1.09 (ref 0.00–1.49)
Prothrombin Time: 14.3 seconds (ref 11.6–15.2)

## 2011-05-02 LAB — CBC
HCT: 36.1 % — ABNORMAL LOW (ref 39.0–52.0)
Hemoglobin: 12.2 g/dL — ABNORMAL LOW (ref 13.0–17.0)
MCH: 30.7 pg (ref 26.0–34.0)
MCHC: 33.8 g/dL (ref 30.0–36.0)
MCV: 90.9 fL (ref 78.0–100.0)
Platelets: 186 10*3/uL (ref 150–400)
RBC: 3.97 MIL/uL — ABNORMAL LOW (ref 4.22–5.81)
RDW: 12.9 % (ref 11.5–15.5)
WBC: 7 10*3/uL (ref 4.0–10.5)

## 2011-05-02 LAB — BASIC METABOLIC PANEL
BUN: 14 mg/dL (ref 6–23)
CO2: 31 mEq/L (ref 19–32)
Calcium: 8.4 mg/dL (ref 8.4–10.5)
Chloride: 97 mEq/L (ref 96–112)
Creatinine, Ser: 0.73 mg/dL (ref 0.50–1.35)
GFR calc Af Amer: >60 mL/min (ref >60)
GFR calc non Af Amer: >60 mL/min (ref >60)
Glucose, Bld: 154 mg/dL — ABNORMAL HIGH (ref 70–99)
Potassium: 4.3 mEq/L (ref 3.5–5.1)
Sodium: 133 mEq/L — ABNORMAL LOW (ref 135–145)

## 2011-05-03 LAB — CBC
HCT: 37.2 % — ABNORMAL LOW (ref 39.0–52.0)
Hemoglobin: 12.6 g/dL — ABNORMAL LOW (ref 13.0–17.0)
MCH: 30.6 pg (ref 26.0–34.0)
MCHC: 33.9 g/dL (ref 30.0–36.0)
MCV: 90.3 fL (ref 78.0–100.0)
Platelets: 201 10*3/uL (ref 150–400)
RBC: 4.12 MIL/uL — ABNORMAL LOW (ref 4.22–5.81)
RDW: 12.9 % (ref 11.5–15.5)
WBC: 6.5 10*3/uL (ref 4.0–10.5)

## 2011-05-03 LAB — PROTIME-INR
INR: 1.08 (ref 0.00–1.49)
Prothrombin Time: 14.2 seconds (ref 11.6–15.2)

## 2011-05-04 LAB — TISSUE CULTURE: Culture: NO GROWTH

## 2011-05-04 NOTE — Op Note (Signed)
NAME:  DARRAL, RISHEL NO.:  [id number]  MEDICAL RECORD NO.:  [id number]  LOCATION:  2550                         FACILITY:  [facility]  PHYSICIAN:  Feliberto Gottron. Turner Daniels, M.D.   DATE OF BIRTH:  [birth year]  DATE OF PROCEDURE:  05/01/2011 DATE OF DISCHARGE:                              OPERATIVE REPORT   PREOPERATIVE DIAGNOSIS:  Loose patella left total knee.  POSTOPERATIVE DIAGNOSIS:  Loose patella left total knee.  PROCEDURE:  Revision left total knee patella, removal of a 41-mm DePuy patellar button and revision to a new 41-mm patellar button.  SURGEON:  Feliberto Gottron.  Turner Daniels, MD  FIRST ASSISTANT:  Shirl Harris PA-C.  ANESTHETIC:  General endotracheal.  ESTIMATED BLOOD LOSS:  Minimal.  FLUID REPLACEMENT:  59 mL crystalloid.  DRAINS PLACED:  Two medium Hemovacs and a Foley catheter.  URINE OUTPUT:  300 mL.  TOURNIQUET TIME:  One hour and 25 minutes.  INDICATIONS FOR PROCEDURE:  This is a 65 year old gentleman who underwent left total knee arthroplasty a few years ago and started developing parapatellar pain and most recently bloody effusion. Cultures have been negative.  Bone scan was consistent with a loosening of the patellar component and most recent x-rays have shown some movement of the patellar component.  He is taken for revision of same and will also assessed the tibia and the femur at the same time, although they did not symptomatically appeared to be loose.  Risks and benefits of surgery discussed, questions answered.  His pain is severe and debilitating and prevents normal activities.  DESCRIPTION OF PROCEDURE:  The patient identified by armband, received preoperative IV antibiotics in the holding area at [facility] and femoral nerve block, taken to operating room #5, appropriate site monitors were attached, endotracheal anesthesia induced.  The patient in supine position.  Tourniquet applied high to the left thigh, foot positioner and  lateral post also applied to the table.  Left lower extremity was prepped and draped in usual sterile fashion from the ankle to the tourniquet.  Time-out procedure was performed.  Limb wrapped with an Esmarch bandage, tourniquet placed at 350 mmHg, began the operation by recreating the old anterior midline incision through the skin and subcutaneous tissue down to the level of the transverse retinaculum over the patella which was then incised and reflected medially and a thick flap.  We then performed a medial parapatellar arthrotomy and mainly find a bloody fluid.  We worked around the patella and the patellar component was grossly loose and removed simply by breech again and taking it off the bony bed.  We also sent off a thick slice of synovial tissue for Gram stain and culture.  This tissue did not appear to be infected.  We then carefully worked around the scar tissue, performed a fairly radical synovectomy throughout the anterior compartment and we were finally able to evert the patella and flex the knee up to about a 120-130 degrees.  We then removed synovial tissue from around the proximal tibia and the distal femur giving [place] access to those components which did not appear to be loose.  We then used an osteotome and a mallet and  tapped on each component to see if there was any looseness or bubbling of fluid from beneath it and there was none.  These components were well-placed and well-fixed as confirmed by x-ray.  At this point, we addressed the patella and did a 1-2 mm cut with the oscillating saw getting down to a good bony bed narrow still 18 mm of residual thickness to the patella prior to this resection.  Then using curettes we were carefully probed for any loose or damage bone, was quite sclerotic.  We had drilled the bone at multiple places with a 330-second drill bit and then sized for a new 41 button and drilled through the lollipop.  At this point, the wound was  thoroughly irrigated out normal saline solution.  The patella was then carefully dried with suction and sponges, a single batch of DePuy HV cement with 750 mg of Zinacef was mixed at the back table and a new 41-mm button was then cemented onto the patellar bed and held there with a clamp until the cement cured.  We then checked our tracking one more time, it was excellent, required no thumb pressure whatsoever.  Medium Hemovac drains were placed from anterolateral approach.  The parapatellar arthrotomy closed with running #1 Vicryl suture.  The subcutaneous tissue with 0 and 2-0 undyed Vicryl suture and the skin with skin staples.  Dressing of Xeroform 4x4 dressing sponges, Webril and Ace wrap was applied.  The patient was awakened, extubated and taken to the recovery room without difficulty.     Feliberto Gottron. Turner Daniels, M.D.     Ovid Curd  D:  05/01/2011  T:  05/01/2011  Job:  [id number]  Electronically Signed by Gean Birchwood M.D. on 05/04/2011 06:21:41 AM

## 2011-05-06 LAB — ANAEROBIC CULTURE

## 2012-01-23 ENCOUNTER — Encounter: Payer: Self-pay | Admitting: Internal Medicine

## 2012-05-21 ENCOUNTER — Encounter: Payer: Self-pay | Admitting: Internal Medicine

## 2012-05-21 ENCOUNTER — Ambulatory Visit (AMBULATORY_SURGERY_CENTER): Payer: Medicare HMO | Admitting: *Deleted

## 2012-05-21 VITALS — Ht 77.0 in | Wt 268.0 lb

## 2012-05-21 DIAGNOSIS — Z1211 Encounter for screening for malignant neoplasm of colon: Secondary | ICD-10-CM

## 2012-05-21 NOTE — Progress Notes (Signed)
Pt already has MOVI PREP

## 2012-06-04 ENCOUNTER — Ambulatory Visit (AMBULATORY_SURGERY_CENTER): Payer: Medicare HMO | Admitting: Internal Medicine

## 2012-06-04 ENCOUNTER — Encounter: Payer: Self-pay | Admitting: Internal Medicine

## 2012-06-04 VITALS — BP 117/77 | HR 56 | Temp 97.7°F | Resp 18 | Ht 77.0 in | Wt 268.0 lb

## 2012-06-04 DIAGNOSIS — Z86010 Personal history of colonic polyps: Secondary | ICD-10-CM

## 2012-06-04 DIAGNOSIS — Z1211 Encounter for screening for malignant neoplasm of colon: Secondary | ICD-10-CM

## 2012-06-04 MED ORDER — SODIUM CHLORIDE 0.9 % IV SOLN: 500.0000 mL | INTRAVENOUS | Status: DC

## 2012-06-04 NOTE — Op Note (Signed)
[facility] [address] [place], [address]   COLONOSCOPY PROCEDURE REPORT  PATIENT: Jerry, Mathews.  MR#: [id number] BIRTHDATE: [birth year] , 66  yrs. old GENDER: Male ENDOSCOPIST: Roxy Cedar, MD REFERRED ZD:[id number] Program Recall PROCEDURE DATE:  06/04/2012 PROCEDURE:   Colonoscopy, surveillance ASA CLASS:   Class II INDICATIONS:High risk screening; patient's personal history of adenomatous colon polyps (prior exams 2001,04,08). MEDICATIONS: MAC sedation, administered by CRNA and propofol (Diprivan) 230mg  IV  DESCRIPTION OF PROCEDURE:   After the risks benefits and alternatives of the procedure were thoroughly explained, informed consent was obtained.  A digital rectal exam revealed no abnormalities of the rectum.   The LB CF-H180AL [id number]  endoscope was introduced through the anus and advanced to the cecum, which was identified by both the appendix and ileocecal valve. No adverse events experienced.   The quality of the prep was good, using MoviPrep  The instrument was then slowly withdrawn as the colon was fully examined.      COLON FINDINGS: A normal appearing cecum, ileocecal valve, and appendiceal orifice were identified.  The ascending, hepatic flexure, transverse, splenic flexure, descending, sigmoid colon and rectum appeared unremarkable.  No polyps or cancers were seen. Retroflexed views revealed no abnormalities. The time to cecum=4 minutes 29 seconds.  Withdrawal time=10 minutes 05 seconds.  The scope was withdrawn and the procedure completed. COMPLICATIONS: There were no complications.  ENDOSCOPIC IMPRESSION: Normal colon  RECOMMENDATIONS: Follow up colonoscopy in 5 years   eSigned:  Roxy Cedar, MD 06/04/2012 10:55 AM   cc: The Patient

## 2012-06-04 NOTE — Patient Instructions (Addendum)

## 2012-06-04 NOTE — Progress Notes (Signed)
Patient did not have preoperative order for IV antibiotic SSI prophylaxis. (G8918)   

## 2012-06-05 ENCOUNTER — Telehealth: Payer: Self-pay | Admitting: *Deleted

## 2012-06-05 NOTE — Telephone Encounter (Signed)
  Follow up Call-  Call back number 06/04/2012  Post procedure Call Back phone  # [phone]  Permission to leave phone message Yes     Patient questions:  Do you have a fever, pain , or abdominal swelling? no Pain Score  0 *  Have you tolerated food without any problems? yes  Have you been able to return to your normal activities? yes  Do you have any questions about your discharge instructions: Diet   no Medications  no Follow up visit  no  Do you have questions or concerns about your Care? no  Actions: * If pain score is 4 or above: No action needed, pain <4.

## 2012-08-12 ENCOUNTER — Encounter: Payer: Self-pay | Admitting: Internal Medicine

## 2012-09-10 ENCOUNTER — Encounter: Payer: Self-pay | Admitting: Internal Medicine

## 2012-09-10 ENCOUNTER — Ambulatory Visit (INDEPENDENT_AMBULATORY_CARE_PROVIDER_SITE_OTHER): Payer: Medicare HMO | Admitting: Internal Medicine

## 2012-09-10 VITALS — BP 110/70 | HR 76 | Ht 77.0 in | Wt 270.6 lb

## 2012-09-10 DIAGNOSIS — K5900 Constipation, unspecified: Secondary | ICD-10-CM

## 2012-09-10 DIAGNOSIS — Z86010 Personal history of colonic polyps: Secondary | ICD-10-CM

## 2012-09-10 NOTE — Patient Instructions (Addendum)
You may take Miralax daily for constipation.  Adjust as needed to obtain the desired effect

## 2012-09-10 NOTE — Progress Notes (Signed)
HISTORY OF PRESENT ILLNESS:  Jerry Mathews is a 66 y.o. male with a history of hypothyroidism, asthma, anxiety/depression, hypertension, and adenomatous colon polyps. His general medical care is through the [facility] system the patient has undergone prior colonoscopies in 2001, 2004, 2008, and most recently 06/04/2012. The examination at that time was normal. Routine followup in 5 years recommended. He presents today, with his wife, regarding new onset constipation. He reports having had normal bowel movements his entire life. However, shortly after completing his colonoscopy, he developed problems with constipation. Around that time, his Effexor dosage was doubled. He states that thyroid testing the summer was normal. For this problem he has been taking Metamucil and Dulcolax tabs once or twice per week. He was also, recently, diagnosed with prostate cancer about 2 months ago. No therapy to date. His GI review of systems is otherwise negative. No dysphagia or weight loss.  REVIEW OF SYSTEMS:  All non-GI ROS negative   Past Medical History  Diagnosis Date  . Colon polyps     adenomatous  . Diverticulosis   . Hemorrhoids   . Anxiety   . Asthma   . Depression   . Hypertension   . Thyroid disease     hypothyroid  . Prostate cancer     Past Surgical History  Procedure Date  . Total knee arthroplasty     leftx 2  . Knee arthroscopy     left x 1  . Knee surgery     to reattach left knee cap  . Mandible reconstruction 1976  . Hand surgery     left     Social History Jerry Mathews  reports that he has quit smoking. He has never used smokeless tobacco. He reports that he drinks alcohol. He reports that he does not use illicit drugs.  family history is negative for Colon cancer, and Esophageal cancer, and Rectal cancer, and Stomach cancer, .  No Known Allergies     PHYSICAL EXAMINATION: Vital signs: BP 110/70  Pulse 76  Ht 6\' 5"  (1.956 m)  Wt 270 lb 9.6 oz (122.743 kg)   BMI 32.09 kg/m2 General: Well-developed, well-nourished, no acute distress HEENT: Sclerae are anicteric, conjunctiva pink. Oral mucosa intact Lungs: Clear Heart: Regular Abdomen: soft, nontender, nondistended, no obvious ascites, no peritoneal signs, normal bowel sounds. No organomegaly. Extremities: No edema Psychiatric: alert and oriented x3. Cooperative    ASSESSMENT:  #1. Constipation. Functional. Likely related to increased dose of Effexor #2. History of adenomatous colon polyps. Normal surveillance colonoscopy 06/04/2012  PLAN:  #1. Fiber, stool softeners, and increase water #2. If ineffective, MiraLax daily. Titrated to need. #3. Surveillance colonoscopy around September 2018. Interval followup as needed

## 2012-12-23 ENCOUNTER — Encounter (HOSPITAL_COMMUNITY): Payer: Self-pay | Admitting: Pharmacy Technician

## 2012-12-31 ENCOUNTER — Encounter (HOSPITAL_COMMUNITY)
Admission: RE | Admit: 2012-12-31 | Discharge: 2012-12-31 | Disposition: A | Discharge status: Home / Self Care | Payer: Medicare HMO | Source: Ambulatory Visit | Attending: Orthopedic Surgery | Admitting: Orthopedic Surgery

## 2012-12-31 ENCOUNTER — Ambulatory Visit (HOSPITAL_COMMUNITY)
Admission: RE | Admit: 2012-12-31 | Discharge: 2012-12-31 | Disposition: A | Discharge status: Home / Self Care | Payer: Medicare HMO | Source: Ambulatory Visit | Attending: Orthopedic Surgery | Admitting: Orthopedic Surgery

## 2012-12-31 ENCOUNTER — Encounter (HOSPITAL_COMMUNITY): Payer: Self-pay

## 2012-12-31 ENCOUNTER — Other Ambulatory Visit (HOSPITAL_COMMUNITY): Payer: Self-pay | Admitting: *Deleted

## 2012-12-31 DIAGNOSIS — Z87891 Personal history of nicotine dependence: Secondary | ICD-10-CM | POA: Insufficient documentation

## 2012-12-31 DIAGNOSIS — R9431 Abnormal electrocardiogram [ECG] [EKG]: Secondary | ICD-10-CM | POA: Insufficient documentation

## 2012-12-31 DIAGNOSIS — Z01810 Encounter for preprocedural cardiovascular examination: Secondary | ICD-10-CM | POA: Insufficient documentation

## 2012-12-31 DIAGNOSIS — Z01818 Encounter for other preprocedural examination: Secondary | ICD-10-CM | POA: Insufficient documentation

## 2012-12-31 DIAGNOSIS — Z01812 Encounter for preprocedural laboratory examination: Secondary | ICD-10-CM | POA: Insufficient documentation

## 2012-12-31 DIAGNOSIS — I498 Other specified cardiac arrhythmias: Secondary | ICD-10-CM | POA: Insufficient documentation

## 2012-12-31 DIAGNOSIS — I10 Essential (primary) hypertension: Secondary | ICD-10-CM | POA: Insufficient documentation

## 2012-12-31 HISTORY — DX: Unspecified osteoarthritis, unspecified site: M19.90

## 2012-12-31 LAB — BASIC METABOLIC PANEL
BUN: 13 mg/dL (ref 6–23)
CO2: 30 mEq/L (ref 19–32)
Calcium: 9.5 mg/dL (ref 8.4–10.5)
Chloride: 101 mEq/L (ref 96–112)
Creatinine, Ser: 0.74 mg/dL (ref 0.50–1.35)
GFR calc Af Amer: >90 mL/min (ref >90)
GFR calc non Af Amer: >90 mL/min (ref >90)
Glucose, Bld: 87 mg/dL (ref 70–99)
Potassium: 4.2 mEq/L (ref 3.5–5.1)
Sodium: 138 mEq/L (ref 135–145)

## 2012-12-31 LAB — APTT: aPTT: 36 seconds (ref 24–37)

## 2012-12-31 LAB — URINALYSIS, ROUTINE W REFLEX MICROSCOPIC
Bilirubin Urine: NEGATIVE
Glucose, UA: NEGATIVE mg/dL
Hgb urine dipstick: NEGATIVE
Ketones, ur: NEGATIVE mg/dL
Leukocytes, UA: NEGATIVE
Nitrite: NEGATIVE
Protein, ur: NEGATIVE mg/dL
Specific Gravity, Urine: 1.008 (ref 1.005–1.030)
Urobilinogen, UA: 0.2 mg/dL (ref 0.0–1.0)
pH: 7 (ref 5.0–8.0)

## 2012-12-31 LAB — SURGICAL PCR SCREEN
MRSA, PCR: NEGATIVE
Staphylococcus aureus: NEGATIVE

## 2012-12-31 LAB — CBC
HCT: 45.1 % (ref 39.0–52.0)
Hemoglobin: 15.5 g/dL (ref 13.0–17.0)
MCH: 30.9 pg (ref 26.0–34.0)
MCHC: 34.4 g/dL (ref 30.0–36.0)
MCV: 90 fL (ref 78.0–100.0)
Platelets: 233 10*3/uL (ref 150–400)
RBC: 5.01 MIL/uL (ref 4.22–5.81)
RDW: 13 % (ref 11.5–15.5)
WBC: 4.9 10*3/uL (ref 4.0–10.5)

## 2012-12-31 LAB — ABO/RH: ABO/RH(D): AB POS

## 2012-12-31 LAB — PROTIME-INR
INR: 1.03 (ref 0.00–1.49)
Prothrombin Time: 13.4 seconds (ref 11.6–15.2)

## 2012-12-31 NOTE — Patient Instructions (Addendum)
Jerry Mathews  12/31/2012                           YOUR PROCEDURE IS SCHEDULED ON: 01/06/13               PLEASE REPORT TO SHORT STAY CENTER AT : 5:00 AM               CALL THIS NUMBER IF ANY PROBLEMS THE DAY OF SURGERY :               [phone]                      REMEMBER:   Do not eat food or drink liquids AFTER MIDNIGHT   Take these medicines the morning of surgery with A SIP OF WATER: GABAPENTIN / LEVOTHYROXINE   Do not wear jewelry, make-up   Do not wear lotions, powders, or perfumes.   Do not shave legs or underarms 12 hrs. before surgery (men may shave face)  Do not bring valuables to the hospital.  Contacts, dentures or bridgework may not be worn into surgery.  Leave suitcase in the car. After surgery it may be brought to your room.  For patients admitted to the hospital more than one night, checkout time is 11:00                          The day of discharge.   Patients discharged the day of surgery will not be allowed to drive home                             If going home same day of surgery, must have someone stay with you first                           24 hrs at home and arrange for some one to drive you home from hospital.    Special Instructions:   Please read over the following fact sheets that you were given:               1. MRSA  INFORMATION                      2. [name] PREPARING FOR SURGERY SHEET               3. INCENTIVE SPIROMETER               4. BRING C-PAP MASK AND TUBING TO HOSPITAL                                                X_____________________________________________________________________        Failure to follow these instructions may result in cancellation of your surgery

## 2013-01-01 NOTE — H&P (Signed)
Jerry Mathews is an 67 y.o. male.    Procedure:   Left knee revision of patella component vs removal of the component  Chief Complaint: Left knee pain S/P total knee arthroplasty  HPI: Pt is a 67 y.o. male complaining of left knee pain since 2005.  He originally hurt his knee in 2005, progressive pain eventually lead to the total knee arthroplasty in 2008.  Since he has had other procedures including a manipulation and a second surgery to replace the patella component of the left knee.  Pain had continually increased since the last revision which was in 2012. X-rays in the clinic show previous total knee arthroplasty with a very thin patella. Pt has tried various conservative treatments which have failed to alleviate their symptoms, including modifications, NSAIDs, ice and pain medications. Various options are discussed with the patient. Risks, benefits and expectations were discussed with the patient. Patient understand the risks, benefits and expectations and wishes to proceed with surgery.   PCP:  Oliver Barre, MD  D/C Plans:    Home with HHPT  Post-op Meds:    Rx given for ASA, Zanaflex, Iron, Colace and MiraLax  Tranexamic Acid:   Not to be given - Prostate CA  Decadron:   To be given  FYI - Nothing to note  PMH: Past Medical History  Diagnosis Date  . Colon polyps     adenomatous  . Diverticulosis   . Anxiety   . Asthma   . Depression   . Hypertension   . Thyroid disease     hypothyroid  . Prostate cancer   . Neuropathy     FEET  . Headache   . Arthritis   . Bruises easily   . Sleep apnea     USES C-PAP    PSH: Past Surgical History  Procedure Laterality Date  . Total knee arthroplasty      left  . Knee arthroscopy      left x 1  . Knee surgery      to reattach left knee cap  . Mandible reconstruction  1976  . Hand surgery      left     Social History:  reports that he quit smoking about 19 years ago. He has never used smokeless tobacco. He reports  that  drinks alcohol. He reports that he does not use illicit drugs.  Allergies:  No Known Allergies  Medications: No current facility-administered medications for this encounter.   Current Outpatient Prescriptions  Medication Sig Dispense Refill  . albuterol (PROVENTIL HFA;VENTOLIN HFA) 108 (90 BASE) MCG/ACT inhaler Inhale 1-2 puffs into the lungs every 6 (six) hours as needed for wheezing or shortness of breath.       . gabapentin (NEURONTIN) 400 MG capsule Take 400-800 mg by mouth 3 (three) times daily. Takes 1 in the morning 1 at noon and 2 at bedtime      . levothyroxine (SYNTHROID, LEVOTHROID) 88 MCG tablet Take 88 mcg by mouth daily before breakfast.       . lisinopril (PRINIVIL,ZESTRIL) 10 MG tablet Take 10 mg by mouth daily before breakfast.       . nortriptyline (PAMELOR) 10 MG capsule Take 10 mg by mouth at bedtime. TAKES 2 NIGHTLY      . pilocarpine (SALAGEN) 5 MG tablet Take 5 mg by mouth 4 (four) times daily.      . traZODone (DESYREL) 50 MG tablet Take 50 mg by mouth at bedtime.      Marland Kitchen  venlafaxine XR (EFFEXOR-XR) 150 MG 24 hr capsule Take 150 mg by mouth every evening.         Results for orders placed during the hospital encounter of 12/31/12 (from the past 48 hour(s))  URINALYSIS, ROUTINE W REFLEX MICROSCOPIC     Status: None   Collection Time    12/31/12 10:20 AM      Result Value Range   Color, Urine YELLOW  YELLOW   APPearance CLEAR  CLEAR   Specific Gravity, Urine 1.008  1.005 - 1.030   pH 7.0  5.0 - 8.0   Glucose, UA NEGATIVE  NEGATIVE mg/dL   Hgb urine dipstick NEGATIVE  NEGATIVE   Bilirubin Urine NEGATIVE  NEGATIVE   Ketones, ur NEGATIVE  NEGATIVE mg/dL   Protein, ur NEGATIVE  NEGATIVE mg/dL   Urobilinogen, UA 0.2  0.0 - 1.0 mg/dL   Nitrite NEGATIVE  NEGATIVE   Leukocytes, UA NEGATIVE  NEGATIVE   Comment: MICROSCOPIC NOT DONE ON URINES WITH NEGATIVE PROTEIN, BLOOD, LEUKOCYTES, NITRITE, OR GLUCOSE <1000 mg/dL.  SURGICAL PCR SCREEN     Status: None    Collection Time    12/31/12 10:21 AM      Result Value Range   MRSA, PCR NEGATIVE  NEGATIVE   Staphylococcus aureus NEGATIVE  NEGATIVE   Comment:            The Xpert SA Assay (FDA     approved for NASAL specimens     in patients over 49 years of age),     is one component of     a comprehensive surveillance     program.  Test performance has     been validated by [employer] for patients greater     than or equal to 16 year old.     It is not intended     to diagnose infection nor to     guide or monitor treatment.  ABO/RH     Status: None   Collection Time    12/31/12 10:30 AM      Result Value Range   ABO/RH(D) AB POS    APTT     Status: None   Collection Time    12/31/12 11:15 AM      Result Value Range   aPTT 36  24 - 37 seconds  BASIC METABOLIC PANEL     Status: None   Collection Time    12/31/12 11:15 AM      Result Value Range   Sodium 138  135 - 145 mEq/L   Potassium 4.2  3.5 - 5.1 mEq/L   Chloride 101  96 - 112 mEq/L   CO2 30  19 - 32 mEq/L   Glucose, Bld 87  70 - 99 mg/dL   BUN 13  6 - 23 mg/dL   Creatinine, Ser 1.61  0.50 - 1.35 mg/dL   Calcium 9.5  8.4 - 09.6 mg/dL   GFR calc non Af Amer >90  >90 mL/min   GFR calc Af Amer >90  >90 mL/min   Comment:            The eGFR has been calculated     using the CKD EPI equation.     This calculation has not been     validated in all clinical     situations.     eGFR's persistently     <90 mL/min signify     possible Chronic Kidney Disease.  CBC  Status: None   Collection Time    12/31/12 11:15 AM      Result Value Range   WBC 4.9  4.0 - 10.5 K/uL   RBC 5.01  4.22 - 5.81 MIL/uL   Hemoglobin 15.5  13.0 - 17.0 g/dL   HCT 30.8  65.7 - 84.6 %   MCV 90.0  78.0 - 100.0 fL   MCH 30.9  26.0 - 34.0 pg   MCHC 34.4  30.0 - 36.0 g/dL   RDW 96.2  95.2 - 84.1 %   Platelets 233  150 - 400 K/uL  PROTIME-INR     Status: None   Collection Time    12/31/12 11:15 AM      Result Value Range   Prothrombin Time  13.4  11.6 - 15.2 seconds   INR 1.03  0.00 - 1.49  TYPE AND SCREEN     Status: None   Collection Time    12/31/12 11:15 AM      Result Value Range   ABO/RH(D) AB POS     Antibody Screen NEG     Sample Expiration 01/14/2013     Dg Chest 2 View  12/31/2012  *RADIOLOGY REPORT*  Clinical Data: Preoperative chest radiograph  CHEST - 2 VIEW  Comparison: 03/25/2012  Findings: The heart size and vascular pattern are normal.  Lungs are clear.  No pleural effusions.  IMPRESSION: No acute cardiopulmonary process.   Original Report Authenticated By: Esperanza Heir, M.D.     Review of Systems  Constitutional: Negative.   HENT: Negative.   Eyes: Negative.   Respiratory: Negative.   Cardiovascular: Negative.   Gastrointestinal: Negative.   Genitourinary: Negative.   Musculoskeletal: Positive for joint pain.  Skin: Negative.   Neurological: Negative.   Endo/Heme/Allergies: Negative.   Psychiatric/Behavioral: Negative.      Physical Exam  Constitutional: He is oriented to person, place, and time and well-developed, well-nourished, and in no distress.  HENT:  Head: Normocephalic and atraumatic.  Eyes: Pupils are equal, round, and reactive to light.  Neck: Neck supple. No JVD present. No tracheal deviation present. No thyromegaly present.  Cardiovascular: Normal rate, regular rhythm, normal heart sounds and intact distal pulses.   Pulmonary/Chest: Effort normal and breath sounds normal. No stridor. No respiratory distress. He has no wheezes.  Abdominal: Soft. There is no tenderness. There is no guarding.  Musculoskeletal:       Left knee: He exhibits decreased range of motion (10-90), swelling, laceration (healed from previous surgery) and bony tenderness. He exhibits no effusion, no ecchymosis, no deformity, no erythema and normal alignment. Tenderness found.  Lymphadenopathy:    He has no cervical adenopathy.  Neurological: He is alert and oriented to person, place, and time.  Skin: Skin is  warm and dry.  Psychiatric: Affect normal.      Assessment/Plan Assessment:  Painful left total knee arthroplasty, patellar component  Plan: Patient will undergo a revision of the patella component versus removal of the component on 01/06/2013 per Dr. Charlann Boxer at [facility]. Risks benefits and expectations were discussed with the patient. Patient understand risks, benefits and expectations and wishes to proceed.   Anastasio Auerbach Kinslee Dalpe   PAC  01/01/2013, 11:19 AM

## 2013-01-04 NOTE — Anesthesia Preprocedure Evaluation (Addendum)
Anesthesia Evaluation  Patient identified by MRN, date of birth, ID band Patient awake    Reviewed: Allergy & Precautions, H&P , NPO status , Patient's Chart, lab work & pertinent test results  Airway Mallampati: II TM Distance: >3 FB Neck ROM: Full    Dental no notable dental hx.    Pulmonary asthma , sleep apnea , COPD COPD inhaler,  breath sounds clear to auscultation  Pulmonary exam normal       Cardiovascular hypertension, Pt. on medications Rhythm:Regular Rate:Normal     Neuro/Psych  Headaches, PSYCHIATRIC DISORDERS Anxiety Depression    GI/Hepatic negative GI ROS, Neg liver ROS,   Endo/Other  Hypothyroidism   Renal/GU negative Renal ROS  negative genitourinary   Musculoskeletal negative musculoskeletal ROS (+)   Abdominal (+) + obese,   Peds negative pediatric ROS (+)  Hematology negative hematology ROS (+)   Anesthesia Other Findings   Reproductive/Obstetrics negative OB ROS                           Anesthesia Physical Anesthesia Plan  ASA: III  Anesthesia Plan: General   Post-op Pain Management:    Induction: Intravenous  Airway Management Planned:   Additional Equipment:   Intra-op Plan:   Post-operative Plan: Extubation in OR  Informed Consent: I have reviewed the patients History and Physical, chart, labs and discussed the procedure including the risks, benefits and alternatives for the proposed anesthesia with the patient or authorized representative who has indicated his/her understanding and acceptance.   Dental advisory given  Plan Discussed with: CRNA  Anesthesia Plan Comments: (Discussed general versus spinal.Discussed risks of femoral nerve block including failure, bleeding, infection, nerve damage.  Femoral nerve block does not usually prevent all pain. Specifically, it treats the anterior, but often not the posterior knee. Questions answered.  Patient  consents to block.)       Anesthesia Quick Evaluation

## 2013-01-06 ENCOUNTER — Encounter (HOSPITAL_COMMUNITY): Payer: Self-pay

## 2013-01-06 ENCOUNTER — Encounter (HOSPITAL_COMMUNITY): Payer: Self-pay | Admitting: Anesthesiology

## 2013-01-06 ENCOUNTER — Inpatient Hospital Stay (HOSPITAL_COMMUNITY)
Admission: RE | Admit: 2013-01-06 | Discharge: 2013-01-07 | DRG: 489 | Disposition: A | Discharge status: Home Health | Payer: Medicare HMO | Source: Ambulatory Visit | Attending: Orthopedic Surgery | Admitting: Orthopedic Surgery

## 2013-01-06 ENCOUNTER — Inpatient Hospital Stay (HOSPITAL_COMMUNITY): Payer: Medicare HMO | Admitting: Anesthesiology

## 2013-01-06 ENCOUNTER — Encounter (HOSPITAL_COMMUNITY)
Admission: RE | Disposition: A | Discharge status: Home Health | Payer: Self-pay | Source: Ambulatory Visit | Attending: Orthopedic Surgery

## 2013-01-06 DIAGNOSIS — Z87891 Personal history of nicotine dependence: Secondary | ICD-10-CM

## 2013-01-06 DIAGNOSIS — C61 Malignant neoplasm of prostate: Secondary | ICD-10-CM | POA: Diagnosis present

## 2013-01-06 DIAGNOSIS — F411 Generalized anxiety disorder: Secondary | ICD-10-CM | POA: Diagnosis present

## 2013-01-06 DIAGNOSIS — F329 Major depressive disorder, single episode, unspecified: Secondary | ICD-10-CM | POA: Diagnosis present

## 2013-01-06 DIAGNOSIS — T84099A Other mechanical complication of unspecified internal joint prosthesis, initial encounter: Principal | ICD-10-CM | POA: Diagnosis present

## 2013-01-06 DIAGNOSIS — Y831 Surgical operation with implant of artificial internal device as the cause of abnormal reaction of the patient, or of later complication, without mention of misadventure at the time of the procedure: Secondary | ICD-10-CM | POA: Diagnosis present

## 2013-01-06 DIAGNOSIS — J45909 Unspecified asthma, uncomplicated: Secondary | ICD-10-CM | POA: Diagnosis present

## 2013-01-06 DIAGNOSIS — Z79899 Other long term (current) drug therapy: Secondary | ICD-10-CM

## 2013-01-06 DIAGNOSIS — I10 Essential (primary) hypertension: Secondary | ICD-10-CM | POA: Diagnosis present

## 2013-01-06 DIAGNOSIS — G589 Mononeuropathy, unspecified: Secondary | ICD-10-CM | POA: Diagnosis present

## 2013-01-06 DIAGNOSIS — E079 Disorder of thyroid, unspecified: Secondary | ICD-10-CM | POA: Diagnosis present

## 2013-01-06 DIAGNOSIS — Z96659 Presence of unspecified artificial knee joint: Secondary | ICD-10-CM

## 2013-01-06 DIAGNOSIS — G4730 Sleep apnea, unspecified: Secondary | ICD-10-CM | POA: Diagnosis present

## 2013-01-06 DIAGNOSIS — Z96652 Presence of left artificial knee joint: Secondary | ICD-10-CM

## 2013-01-06 DIAGNOSIS — F3289 Other specified depressive episodes: Secondary | ICD-10-CM | POA: Diagnosis present

## 2013-01-06 HISTORY — PX: TOTAL KNEE REVISION: SHX996

## 2013-01-06 LAB — TYPE AND SCREEN
ABO/RH(D): AB POS
Antibody Screen: NEGATIVE

## 2013-01-06 SURGERY — TOTAL KNEE REVISION
Anesthesia: General | Site: Knee | Laterality: Left | Wound class: Clean

## 2013-01-06 MED ORDER — PROPOFOL 10 MG/ML IV BOLUS
INTRAVENOUS | Status: DC | PRN
  Administered 2013-01-06: 200 mg via INTRAVENOUS

## 2013-01-06 MED ORDER — GABAPENTIN 400 MG PO CAPS: 400.0000 mg | ORAL_CAPSULE | Freq: Three times a day (TID) | ORAL | Status: DC

## 2013-01-06 MED ORDER — FLEET ENEMA 7-19 GM/118ML RE ENEM: 1.0000 | ENEMA | Freq: Once | RECTAL | Status: AC | PRN

## 2013-01-06 MED ORDER — BUPIVACAINE LIPOSOME 1.3 % IJ SUSP
INTRAMUSCULAR | Status: DC | PRN
  Administered 2013-01-06: 20 mL

## 2013-01-06 MED ORDER — DEXAMETHASONE SODIUM PHOSPHATE 10 MG/ML IJ SOLN
INTRAMUSCULAR | Status: DC | PRN
  Administered 2013-01-06: 10 mg via INTRAVENOUS

## 2013-01-06 MED ORDER — NORTRIPTYLINE HCL 10 MG PO CAPS
20.0000 mg | ORAL_CAPSULE | Freq: Every day | ORAL | Status: DC
  Administered 2013-01-06: 20 mg via ORAL
  Filled 2013-01-06 (×2): qty 2

## 2013-01-06 MED ORDER — DIPHENHYDRAMINE HCL 25 MG PO CAPS: 25.0000 mg | ORAL_CAPSULE | Freq: Four times a day (QID) | ORAL | Status: DC | PRN

## 2013-01-06 MED ORDER — LIDOCAINE HCL (CARDIAC) 20 MG/ML IV SOLN
INTRAVENOUS | Status: DC | PRN
  Administered 2013-01-06: 80 mg via INTRAVENOUS
  Administered 2013-01-06: 50 mg via INTRAVENOUS

## 2013-01-06 MED ORDER — DOCUSATE SODIUM 100 MG PO CAPS
100.0000 mg | ORAL_CAPSULE | Freq: Two times a day (BID) | ORAL | Status: DC
  Administered 2013-01-06 – 2013-01-07 (×2): 100 mg via ORAL

## 2013-01-06 MED ORDER — VENLAFAXINE HCL ER 150 MG PO CP24
150.0000 mg | ORAL_CAPSULE | Freq: Every day | ORAL | Status: DC
  Administered 2013-01-06: 150 mg via ORAL
  Filled 2013-01-06 (×2): qty 1

## 2013-01-06 MED ORDER — SODIUM CHLORIDE 0.9 % IR SOLN
Status: DC | PRN
  Administered 2013-01-06: 1000 mL

## 2013-01-06 MED ORDER — SODIUM CHLORIDE 0.9 % IV SOLN
INTRAVENOUS | Status: DC
  Administered 2013-01-06 – 2013-01-07 (×2): via INTRAVENOUS
  Filled 2013-01-06 (×4): qty 1000

## 2013-01-06 MED ORDER — EPHEDRINE SULFATE 50 MG/ML IJ SOLN
INTRAMUSCULAR | Status: DC | PRN
  Administered 2013-01-06 (×2): 5 mg via INTRAVENOUS

## 2013-01-06 MED ORDER — CELECOXIB 200 MG PO CAPS
200.0000 mg | ORAL_CAPSULE | Freq: Two times a day (BID) | ORAL | Status: DC
  Administered 2013-01-06 – 2013-01-07 (×3): 200 mg via ORAL
  Filled 2013-01-06 (×4): qty 1

## 2013-01-06 MED ORDER — DEXAMETHASONE SODIUM PHOSPHATE 10 MG/ML IJ SOLN
10.0000 mg | Freq: Once | INTRAMUSCULAR | Status: AC
  Administered 2013-01-07: 10 mg via INTRAVENOUS
  Filled 2013-01-06: qty 1

## 2013-01-06 MED ORDER — LACTATED RINGERS IV SOLN
INTRAVENOUS | Status: DC | PRN
  Administered 2013-01-06 (×2): via INTRAVENOUS

## 2013-01-06 MED ORDER — ACETAMINOPHEN 10 MG/ML IV SOLN
INTRAVENOUS | Status: DC | PRN
  Administered 2013-01-06: 1000 mg via INTRAVENOUS

## 2013-01-06 MED ORDER — CHLORHEXIDINE GLUCONATE 4 % EX LIQD
60.0000 mL | Freq: Once | CUTANEOUS | Status: DC
  Filled 2013-01-06: qty 60

## 2013-01-06 MED ORDER — HYDROMORPHONE HCL PF 1 MG/ML IJ SOLN: 0.5000 mg | INTRAMUSCULAR | Status: DC | PRN

## 2013-01-06 MED ORDER — ALUM & MAG HYDROXIDE-SIMETH 200-200-20 MG/5ML PO SUSP: 30.0000 mL | ORAL | Status: DC | PRN

## 2013-01-06 MED ORDER — PHENOL 1.4 % MT LIQD: 1.0000 | OROMUCOSAL | Status: DC | PRN

## 2013-01-06 MED ORDER — HYDROMORPHONE HCL PF 1 MG/ML IJ SOLN
0.2500 mg | INTRAMUSCULAR | Status: DC | PRN
  Administered 2013-01-06 (×2): 0.5 mg via INTRAVENOUS

## 2013-01-06 MED ORDER — GABAPENTIN 400 MG PO CAPS
800.0000 mg | ORAL_CAPSULE | Freq: Every day | ORAL | Status: DC
  Administered 2013-01-06: 800 mg via ORAL
  Filled 2013-01-06 (×2): qty 2

## 2013-01-06 MED ORDER — GABAPENTIN 400 MG PO CAPS
400.0000 mg | ORAL_CAPSULE | ORAL | Status: DC
  Administered 2013-01-06 – 2013-01-07 (×2): 400 mg via ORAL
  Filled 2013-01-06 (×4): qty 1

## 2013-01-06 MED ORDER — NEOSTIGMINE METHYLSULFATE 1 MG/ML IJ SOLN
INTRAMUSCULAR | Status: DC | PRN
  Administered 2013-01-06: 2.5 mg via INTRAVENOUS

## 2013-01-06 MED ORDER — METHOCARBAMOL 100 MG/ML IJ SOLN: 500.0000 mg | Freq: Four times a day (QID) | INTRAVENOUS | Status: DC | PRN

## 2013-01-06 MED ORDER — SODIUM CHLORIDE 0.9 % IJ SOLN
INTRAMUSCULAR | Status: DC | PRN
  Administered 2013-01-06: 50 mL via INTRAVENOUS

## 2013-01-06 MED ORDER — FENTANYL CITRATE 0.05 MG/ML IJ SOLN
INTRAMUSCULAR | Status: DC | PRN
  Administered 2013-01-06 (×2): 50 ug via INTRAVENOUS
  Administered 2013-01-06: 25 ug via INTRAVENOUS

## 2013-01-06 MED ORDER — MENTHOL 3 MG MT LOZG: 1.0000 | LOZENGE | OROMUCOSAL | Status: DC | PRN

## 2013-01-06 MED ORDER — ONDANSETRON HCL 4 MG PO TABS: 4.0000 mg | ORAL_TABLET | Freq: Four times a day (QID) | ORAL | Status: DC | PRN

## 2013-01-06 MED ORDER — BUPIVACAINE LIPOSOME 1.3 % IJ SUSP
20.0000 mL | Freq: Once | INTRAMUSCULAR | Status: DC
  Filled 2013-01-06: qty 20

## 2013-01-06 MED ORDER — HYDROCODONE-ACETAMINOPHEN 7.5-325 MG PO TABS
1.0000 | ORAL_TABLET | ORAL | Status: DC
  Administered 2013-01-06 (×3): 2 via ORAL
  Administered 2013-01-07: 1 via ORAL
  Administered 2013-01-07: 2 via ORAL
  Administered 2013-01-07: 1 via ORAL
  Filled 2013-01-06 (×5): qty 2
  Filled 2013-01-06: qty 1

## 2013-01-06 MED ORDER — ONDANSETRON HCL 4 MG/2ML IJ SOLN
INTRAMUSCULAR | Status: DC | PRN
  Administered 2013-01-06: 4 mg via INTRAVENOUS

## 2013-01-06 MED ORDER — POLYETHYLENE GLYCOL 3350 17 G PO PACK
17.0000 g | PACK | Freq: Two times a day (BID) | ORAL | Status: DC
  Administered 2013-01-07: 17 g via ORAL

## 2013-01-06 MED ORDER — ROCURONIUM BROMIDE 100 MG/10ML IV SOLN
INTRAVENOUS | Status: DC | PRN
  Administered 2013-01-06: 50 mg via INTRAVENOUS

## 2013-01-06 MED ORDER — FERROUS SULFATE 325 (65 FE) MG PO TABS
325.0000 mg | ORAL_TABLET | Freq: Three times a day (TID) | ORAL | Status: DC
  Administered 2013-01-06 – 2013-01-07 (×3): 325 mg via ORAL
  Filled 2013-01-06 (×6): qty 1

## 2013-01-06 MED ORDER — ROPIVACAINE HCL 5 MG/ML IJ SOLN
INTRAMUSCULAR | Status: DC | PRN
  Administered 2013-01-06: 30 mL

## 2013-01-06 MED ORDER — TRAZODONE HCL 50 MG PO TABS
50.0000 mg | ORAL_TABLET | Freq: Every day | ORAL | Status: DC
  Administered 2013-01-06: 50 mg via ORAL
  Filled 2013-01-06 (×2): qty 1

## 2013-01-06 MED ORDER — LEVOTHYROXINE SODIUM 88 MCG PO TABS
88.0000 ug | ORAL_TABLET | Freq: Every day | ORAL | Status: DC
  Administered 2013-01-07: 88 ug via ORAL
  Filled 2013-01-06 (×2): qty 1

## 2013-01-06 MED ORDER — ONDANSETRON HCL 4 MG/2ML IJ SOLN: 4.0000 mg | Freq: Four times a day (QID) | INTRAMUSCULAR | Status: DC | PRN

## 2013-01-06 MED ORDER — PILOCARPINE HCL 5 MG PO TABS
5.0000 mg | ORAL_TABLET | Freq: Four times a day (QID) | ORAL | Status: DC
  Administered 2013-01-06 – 2013-01-07 (×4): 5 mg via ORAL
  Filled 2013-01-06 (×7): qty 1

## 2013-01-06 MED ORDER — BISACODYL 10 MG RE SUPP: 10.0000 mg | Freq: Every day | RECTAL | Status: DC | PRN

## 2013-01-06 MED ORDER — GLYCOPYRROLATE 0.2 MG/ML IJ SOLN
INTRAMUSCULAR | Status: DC | PRN
  Administered 2013-01-06 (×2): 0.2 mg via INTRAVENOUS
  Administered 2013-01-06: 0.3 mg via INTRAVENOUS

## 2013-01-06 MED ORDER — PHENYLEPHRINE HCL 10 MG/ML IJ SOLN
INTRAMUSCULAR | Status: DC | PRN
  Administered 2013-01-06: 40 ug via INTRAVENOUS

## 2013-01-06 MED ORDER — METOCLOPRAMIDE HCL 5 MG/ML IJ SOLN: 5.0000 mg | Freq: Three times a day (TID) | INTRAMUSCULAR | Status: DC | PRN

## 2013-01-06 MED ORDER — CEFAZOLIN SODIUM-DEXTROSE 2-3 GM-% IV SOLR
2.0000 g | Freq: Four times a day (QID) | INTRAVENOUS | Status: AC
  Administered 2013-01-06 (×2): 2 g via INTRAVENOUS
  Filled 2013-01-06 (×2): qty 50

## 2013-01-06 MED ORDER — RIVAROXABAN 10 MG PO TABS
10.0000 mg | ORAL_TABLET | ORAL | Status: DC
  Administered 2013-01-07: 10 mg via ORAL
  Filled 2013-01-06 (×2): qty 1

## 2013-01-06 MED ORDER — METHOCARBAMOL 500 MG PO TABS
500.0000 mg | ORAL_TABLET | Freq: Four times a day (QID) | ORAL | Status: DC | PRN
Start: 2013-01-06 — End: 2013-01-07
  Administered 2013-01-06 – 2013-01-07 (×2): 500 mg via ORAL
  Filled 2013-01-06: qty 1

## 2013-01-06 MED ORDER — PROMETHAZINE HCL 25 MG/ML IJ SOLN: 6.2500 mg | INTRAMUSCULAR | Status: DC | PRN

## 2013-01-06 MED ORDER — ALBUTEROL SULFATE HFA 108 (90 BASE) MCG/ACT IN AERS
1.0000 | INHALATION_SPRAY | Freq: Four times a day (QID) | RESPIRATORY_TRACT | Status: DC | PRN
Start: 2013-01-06 — End: 2013-01-07
  Filled 2013-01-06: qty 6.7

## 2013-01-06 MED ORDER — NORTRIPTYLINE HCL 10 MG PO CAPS: 10.0000 mg | ORAL_CAPSULE | Freq: Every day | ORAL | Status: DC

## 2013-01-06 MED ORDER — DEXTROSE 5 % IV SOLN
3.0000 g | INTRAVENOUS | Status: AC
  Administered 2013-01-06: 3 g via INTRAVENOUS
  Filled 2013-01-06: qty 3000

## 2013-01-06 MED ORDER — METOCLOPRAMIDE HCL 10 MG PO TABS: 5.0000 mg | ORAL_TABLET | Freq: Three times a day (TID) | ORAL | Status: DC | PRN

## 2013-01-06 MED ORDER — MIDAZOLAM HCL 5 MG/5ML IJ SOLN
INTRAMUSCULAR | Status: DC | PRN
  Administered 2013-01-06: 2 mg via INTRAVENOUS

## 2013-01-06 SURGICAL SUPPLY — 68 items
ADH SKN CLS APL DERMABOND .7 (GAUZE/BANDAGES/DRESSINGS) ×2
BAG SPEC THK2 15X12 ZIP CLS (MISCELLANEOUS) ×1
BAG ZIPLOCK 12X15 (MISCELLANEOUS) ×2 IMPLANT
BANDAGE ELASTIC 6 VELCRO ST LF (GAUZE/BANDAGES/DRESSINGS) ×2 IMPLANT
BANDAGE ESMARK 6X9 LF (GAUZE/BANDAGES/DRESSINGS) ×1 IMPLANT
BLADE SAW SGTL 13.0X1.19X90.0M (BLADE) ×2 IMPLANT
BLADE SAW SGTL 81X20 HD (BLADE) ×2 IMPLANT
BLADE SURG SZ10 CARB STEEL (BLADE) ×2 IMPLANT
BNDG CMPR 9X6 STRL LF SNTH (GAUZE/BANDAGES/DRESSINGS) ×1
BNDG ESMARK 6X9 LF (GAUZE/BANDAGES/DRESSINGS) ×2
BOWL SMART MIX CTS (DISPOSABLE) IMPLANT
CEMENT HV SMART SET (Cement) ×2 IMPLANT
CLOTH BEACON ORANGE TIMEOUT ST (SAFETY) ×2 IMPLANT
CUFF TOURN SGL QUICK 34 (TOURNIQUET CUFF) ×2
CUFF TRNQT CYL 34X4X40X1 (TOURNIQUET CUFF) ×1 IMPLANT
DERMABOND ADVANCED (GAUZE/BANDAGES/DRESSINGS) ×2
DERMABOND ADVANCED .7 DNX12 (GAUZE/BANDAGES/DRESSINGS) ×2 IMPLANT
DRAPE EXTREMITY T 121X128X90 (DRAPE) ×2 IMPLANT
DRAPE POUCH INSTRU U-SHP 10X18 (DRAPES) ×2 IMPLANT
DRAPE U-SHAPE 47X51 STRL (DRAPES) ×2 IMPLANT
DRSG ADAPTIC 3X8 NADH LF (GAUZE/BANDAGES/DRESSINGS) ×2 IMPLANT
DRSG AQUACEL AG ADV 3.5X 6 (GAUZE/BANDAGES/DRESSINGS) ×2 IMPLANT
DRSG AQUACEL AG ADV 3.5X10 (GAUZE/BANDAGES/DRESSINGS) ×2 IMPLANT
DRSG PAD ABDOMINAL 8X10 ST (GAUZE/BANDAGES/DRESSINGS) ×6 IMPLANT
DRSG TEGADERM 4X4.75 (GAUZE/BANDAGES/DRESSINGS) ×2 IMPLANT
DURAPREP 26ML APPLICATOR (WOUND CARE) ×2 IMPLANT
ELECT REM PT RETURN 9FT ADLT (ELECTROSURGICAL) ×2
ELECTRODE REM PT RTRN 9FT ADLT (ELECTROSURGICAL) ×1 IMPLANT
EVACUATOR 1/8 PVC DRAIN (DRAIN) ×2 IMPLANT
FACESHIELD LNG OPTICON STERILE (SAFETY) ×10 IMPLANT
GAUZE SPONGE 2X2 8PLY STRL LF (GAUZE/BANDAGES/DRESSINGS) ×1 IMPLANT
GLOVE BIOGEL PI IND STRL 7.5 (GLOVE) ×1 IMPLANT
GLOVE BIOGEL PI IND STRL 8 (GLOVE) ×2 IMPLANT
GLOVE BIOGEL PI INDICATOR 7.5 (GLOVE) ×1
GLOVE BIOGEL PI INDICATOR 8 (GLOVE) ×2
GLOVE ECLIPSE 8.0 STRL XLNG CF (GLOVE) ×4 IMPLANT
GLOVE ORTHO TXT STRL SZ7.5 (GLOVE) ×4 IMPLANT
GOWN BRE IMP PREV XXLGXLNG (GOWN DISPOSABLE) ×4 IMPLANT
GOWN STRL NON-REIN LRG LVL3 (GOWN DISPOSABLE) ×2 IMPLANT
HANDPIECE INTERPULSE COAX TIP (DISPOSABLE) ×2
IMMOBILIZER KNEE 20 (SOFTGOODS)
IMMOBILIZER KNEE 20 THIGH 36 (SOFTGOODS) IMPLANT
KIT BASIN OR (CUSTOM PROCEDURE TRAY) ×2 IMPLANT
MANIFOLD NEPTUNE II (INSTRUMENTS) ×2 IMPLANT
NDL SAFETY ECLIPSE 18X1.5 (NEEDLE) ×1 IMPLANT
NEEDLE HYPO 18GX1.5 SHARP (NEEDLE) ×2
NS IRRIG 1000ML POUR BTL (IV SOLUTION) ×2 IMPLANT
PACK TOTAL JOINT (CUSTOM PROCEDURE TRAY) ×2 IMPLANT
PADDING CAST COTTON 6X4 STRL (CAST SUPPLIES) ×4 IMPLANT
PATELLA DOME PFC 38MM (Knees) ×2 IMPLANT
POSITIONER SURGICAL ARM (MISCELLANEOUS) ×2 IMPLANT
SET HNDPC FAN SPRY TIP SCT (DISPOSABLE) ×1 IMPLANT
SET PAD KNEE POSITIONER (MISCELLANEOUS) ×2 IMPLANT
SPONGE GAUZE 2X2 STER 10/PKG (GAUZE/BANDAGES/DRESSINGS) ×1
SPONGE GAUZE 4X4 12PLY (GAUZE/BANDAGES/DRESSINGS) ×4 IMPLANT
SPONGE LAP 18X18 X RAY DECT (DISPOSABLE) ×2 IMPLANT
STAPLER VISISTAT 35W (STAPLE) IMPLANT
SUCTION FRAZIER 12FR DISP (SUCTIONS) ×2 IMPLANT
SUT MNCRL AB 3-0 PS2 18 (SUTURE) ×2 IMPLANT
SUT VIC AB 1 CT1 36 (SUTURE) ×4 IMPLANT
SUT VIC AB 2-0 CT1 27 (SUTURE) ×8
SUT VIC AB 2-0 CT1 TAPERPNT 27 (SUTURE) ×4 IMPLANT
SYR 50ML LL SCALE MARK (SYRINGE) ×2 IMPLANT
TOWEL OR 17X26 10 PK STRL BLUE (TOWEL DISPOSABLE) ×4 IMPLANT
TOWER CARTRIDGE SMART MIX (DISPOSABLE) ×2 IMPLANT
TRAY FOLEY CATH 14FRSI W/METER (CATHETERS) ×2 IMPLANT
WATER STERILE IRR 1500ML POUR (IV SOLUTION) ×2 IMPLANT
WRAP KNEE MAXI GEL POST OP (GAUZE/BANDAGES/DRESSINGS) ×2 IMPLANT

## 2013-01-06 NOTE — Anesthesia Procedure Notes (Signed)
Anesthesia Regional Block:  Femoral nerve block  Pre-Anesthetic Checklist: ,, timeout performed, Correct Patient, Correct Site, Correct Laterality, Correct Procedure, Correct Position, site marked, Risks and benefits discussed,  Surgical consent,  Pre-op evaluation,  At surgeon's request and post-op pain management  Laterality: Left and Lower  Prep: chloraprep       Needles:  Injection technique: Single-shot  Needle Type: Stimiplex      Needle Gauge: 21 G    Additional Needles:  Procedures: ultrasound guided (picture in chart) Femoral nerve block Narrative:  Start time: 01/06/2013 7:15 AM  Performed by: Personally  Anesthesiologist: Jhovani Griswold  Additional Notes: No pain on injection. No increased resistance to injection. Motor intact immediately after block. Loss of quadriceps strength at 20 minutes.

## 2013-01-06 NOTE — Progress Notes (Signed)
Received orders for rw and commode.  Per patient's wife, he already has both pieces at home.  No DME needs at this time.

## 2013-01-06 NOTE — Anesthesia Postprocedure Evaluation (Signed)
  Anesthesia Post-op Note  Patient: Jerry Mathews  Procedure(s) Performed: Procedure(s) (LRB): PATELLA COMPONENT REVISION / removal and replacement of patella component  (Left)  Patient Location: PACU  Anesthesia Type: GA combined with regional for post-op pain  Level of Consciousness: awake and alert   Airway and Oxygen Therapy: Patient Spontanous Breathing  Post-op Pain: mild  Post-op Assessment: Post-op Vital signs reviewed, Patient's Cardiovascular Status Stable, Respiratory Function Stable, Patent Airway and No signs of Nausea or vomiting  Last Vitals:  Filed Vitals:   01/06/13 1015  BP: 121/83  Pulse: 54  Temp:   Resp: 14    Post-op Vital Signs: stable   Complications: No apparent anesthesia complications. To floor on CO2 and pulse oximetry monitoring for OSA.

## 2013-01-06 NOTE — Interval H&P Note (Signed)
History and Physical Interval Note:  01/06/2013 6:55 AM  Jerry Mathews  has presented today for surgery, with the diagnosis of FAILED LEFT TOTAL KNEE  The various methods of treatment have been discussed with the patient and family. After consideration of risks, benefits and other options for treatment, the patient has consented to  Procedure(s): PATELLA COMPONENT REVISION VERSES REMOVAL OF COMPONENT (Left) as a surgical intervention .  The patient's history has been reviewed, patient examined, no change in status, stable for surgery.  I have reviewed the patient's chart and labs.  Questions were answered to the patient's satisfaction.     [name]

## 2013-01-06 NOTE — Brief Op Note (Signed)
01/06/2013  9:05 AM  PATIENT:  Jerry Mathews  67 y.o. male  PRE-OPERATIVE DIAGNOSIS:  FAILED LEFT TOTAL KNEE  POST-OPERATIVE DIAGNOSIS:  FAILED LEFT TOTAL KNEE, patella fracture  PROCEDURE:  Procedure(s) with comments: PATELLA COMPONENT REVISION / removal and replacement of patella component  (Left)  Excision of patella fractured fragment  SURGEON:  Surgeon(s) and Role:    * [name], MD - Primary  PHYSICIAN ASSISTANT: Lanney Gins, PA-C  ANESTHESIA:   regional and general  EBL:  Total I/O In: 1000 [I.V.:1000] Out: 200 [Urine:175; Blood:25]  BLOOD ADMINISTERED:none  DRAINS: (1 medium) Hemovact drain(s) in the left knee with  Suction Open   LOCAL MEDICATIONS USED:  OTHER, Exparel, 20cc  SPECIMEN:  No Specimen  DISPOSITION OF SPECIMEN:  N/A  COUNTS:  YES  TOURNIQUET:   Total Tourniquet Time Documented: Thigh (Left) - 36 minutes Total: Thigh (Left) - 36 minutes   DICTATION: .Other Dictation: Dictation Number [id number]  PLAN OF CARE: Admit to inpatient   PATIENT DISPOSITION:  PACU - hemodynamically stable.   Delay start of Pharmacological VTE agent (>24hrs) due to surgical blood loss or risk of bleeding: no

## 2013-01-06 NOTE — Transfer of Care (Signed)
Immediate Anesthesia Transfer of Care Note  Patient: Jerry Mathews  Procedure(s) Performed: Procedure(s) with comments: PATELLA COMPONENT REVISION / removal and replacement of patella component  (Left) - pt. also has a femoral nerve block in left leg.  Patient Location: PACU  Anesthesia Type:GA combined with regional for post-op pain  Level of Consciousness: awake, alert , oriented and patient cooperative  Airway & Oxygen Therapy: Patient Spontanous Breathing and Patient connected to face mask oxygen  Post-op Assessment: Report given to PACU RN, Post -op Vital signs reviewed and stable and Patient moving all extremities X 4  Post vital signs: Reviewed and stable  Complications: No apparent anesthesia complications

## 2013-01-06 NOTE — Evaluation (Signed)
Physical Therapy Evaluation Patient Details Name: Jerry Mathews MRN: [id number] DOB: 31-Aug-1946 Today's Date: 01/06/2013 Time: 1415-1450 PT Time Calculation (min): 35 min  PT Assessment / Plan / Recommendation Clinical Impression  pt is  s/p left patellar revision and will benefit from PT to maximize independence for retunr to home setting with wife support;     PT Assessment  Patient needs continued PT services    Follow Up Recommendations  Home health PT    Does the patient have the potential to tolerate intense rehabilitation      Barriers to Discharge        Equipment Recommendations  None recommended by PT    Recommendations for Other Services     Frequency 7X/week    Precautions / Restrictions Precautions Precautions: Knee Required Braces or Orthoses: Knee Immobilizer - Left Knee Immobilizer - Left: Discontinue once straight leg raise with < 10 degree lag Restrictions LLE Weight Bearing: Weight bearing as tolerated   Pertinent Vitals/Pain       Mobility  Bed Mobility Bed Mobility: Supine to Sit Supine to Sit: 4: Min assist Details for Bed Mobility Assistance: cues for technique Transfers Transfers: Sit to Stand;Stand to Sit Sit to Stand: 4: Min assist Stand to Sit: 4: Min assist Details for Transfer Assistance: cues for safety and hand placement Ambulation/Gait Ambulation/Gait Assistance: 4: Min Environmental consultant (Feet): 80 Feet Assistive device: Rolling walker Ambulation/Gait Assistance Details: cues for step length and walker safety;  Gait Pattern: Step-to pattern;Step-through pattern    Exercises General Exercises - Lower Extremity Ankle Circles/Pumps: AROM;Both;10 reps Quad Sets: AROM;5 reps;10 reps   PT Diagnosis: Difficulty walking  PT Problem List: Decreased strength;Decreased range of motion;Decreased knowledge of use of DME;Decreased mobility PT Treatment Interventions: DME instruction;Gait training;Stair training;Functional  mobility training;Therapeutic activities;Therapeutic exercise;Patient/family education   PT Goals Acute Rehab PT Goals PT Goal Formulation: With patient Time For Goal Achievement: 01/09/13 Potential to Achieve Goals: Good Pt will go Supine/Side to Sit: with modified independence PT Goal: Supine/Side to Sit - Progress: Goal set today Pt will go Sit to Supine/Side: with modified independence PT Goal: Sit to Supine/Side - Progress: Goal set today Pt will go Sit to Stand: with modified independence PT Goal: Sit to Stand - Progress: Goal set today Pt will go Stand to Sit: with modified independence PT Goal: Stand to Sit - Progress: Goal set today Pt will Ambulate: >150 feet;with modified independence;with supervision;with rolling walker PT Goal: Ambulate - Progress: Goal set today Pt will Go Up / Down Stairs: 3-5 stairs;with supervision;with rail(s);with least restrictive assistive device PT Goal: Up/Down Stairs - Progress: Goal set today Pt will Perform Home Exercise Program: with supervision, verbal cues required/provided PT Goal: Perform Home Exercise Program - Progress: Goal set today  Visit Information  Last PT Received On: 01/06/13 Assistance Needed: +1    Subjective Data  Subjective: yes have done this before, 3 times Patient Stated Goal: home   Prior Functioning  Home Living Lives With: Spouse Available Help at Discharge: Family;Available 24 hours/day Type of Home: House Home Access: Stairs to enter [employer] of Steps: 5 Entrance Stairs-Rails: Right Home Layout: One level Home Adaptive Equipment: Walker - rolling;Crutches;Bedside commode/3-in-1;Straight cane Prior Function Level of Independence: Independent;Independent with assistive device(s) (amb with cane) Able to Take Stairs?: Yes Driving: Yes Communication Communication: No difficulties    Cognition  Cognition Overall Cognitive Status: Appears within functional limits for tasks  assessed/performed Arousal/Alertness: Awake/alert Orientation Level: Appears intact for tasks assessed Behavior  During Session: [facility] for tasks performed    Extremity/Trunk Assessment Right Upper Extremity Assessment RUE ROM/Strength/Tone: [facility] for tasks assessed Left Upper Extremity Assessment LUE ROM/Strength/Tone: [facility] for tasks assessed Right Lower Extremity Assessment RLE ROM/Strength/Tone: [facility] for tasks assessed Left Lower Extremity Assessment LLE ROM/Strength/Tone: Deficits LLE ROM/Strength/Tone Deficits: hip flexion 3+/5; assists with SLR; ankle WFL; states leg feels "weird" from nerve block although intact to light touch   Balance    End of Session PT - End of Session Equipment Utilized During Treatment: Gait belt;Left knee immobilizer Activity Tolerance: Patient tolerated treatment well Patient left: in chair;with call bell/phone within reach;with family/visitor present;with nursing in room Nurse Communication: Mobility status  GP     [facility] 01/06/2013, 4:23 PM

## 2013-01-06 NOTE — Preoperative (Signed)
Beta Blockers   Reason not to administer Beta Blockers:Not Applicable 

## 2013-01-06 NOTE — Progress Notes (Signed)
Spoke with pt at this time about wearing cpap tonight & he refused. He said he told the previous RRT that he didn't want it tonight. He said he was probably going to be up all night & wouldn't need it.  Jacqulynn Cadet RRT

## 2013-01-07 ENCOUNTER — Encounter (HOSPITAL_COMMUNITY): Payer: Self-pay | Admitting: Orthopedic Surgery

## 2013-01-07 LAB — CBC
HCT: 34.9 % — ABNORMAL LOW (ref 39.0–52.0)
Hemoglobin: 12 g/dL — ABNORMAL LOW (ref 13.0–17.0)
MCH: 31 pg (ref 26.0–34.0)
MCHC: 34.4 g/dL (ref 30.0–36.0)
MCV: 90.2 fL (ref 78.0–100.0)
Platelets: 216 10*3/uL (ref 150–400)
RBC: 3.87 MIL/uL — ABNORMAL LOW (ref 4.22–5.81)
RDW: 13.2 % (ref 11.5–15.5)
WBC: 8.9 10*3/uL (ref 4.0–10.5)

## 2013-01-07 LAB — BASIC METABOLIC PANEL
BUN: 11 mg/dL (ref 6–23)
CO2: 30 mEq/L (ref 19–32)
Calcium: 8.8 mg/dL (ref 8.4–10.5)
Chloride: 102 mEq/L (ref 96–112)
Creatinine, Ser: 0.66 mg/dL (ref 0.50–1.35)
GFR calc Af Amer: >90 mL/min (ref >90)
GFR calc non Af Amer: >90 mL/min (ref >90)
Glucose, Bld: 133 mg/dL — ABNORMAL HIGH (ref 70–99)
Potassium: 4.1 mEq/L (ref 3.5–5.1)
Sodium: 136 mEq/L (ref 135–145)

## 2013-01-07 MED ORDER — TIZANIDINE HCL 4 MG PO CAPS: 4.0000 mg | ORAL_CAPSULE | Freq: Three times a day (TID) | ORAL | Status: DC

## 2013-01-07 MED ORDER — DSS 100 MG PO CAPS: 100.0000 mg | ORAL_CAPSULE | Freq: Two times a day (BID) | ORAL | Status: DC

## 2013-01-07 MED ORDER — ASPIRIN EC 325 MG PO TBEC: 325.0000 mg | DELAYED_RELEASE_TABLET | Freq: Two times a day (BID) | ORAL | Status: DC

## 2013-01-07 MED ORDER — FERROUS SULFATE 325 (65 FE) MG PO TABS: 325.0000 mg | ORAL_TABLET | Freq: Three times a day (TID) | ORAL | Status: DC

## 2013-01-07 MED ORDER — POLYETHYLENE GLYCOL 3350 17 G PO PACK: 17.0000 g | PACK | Freq: Two times a day (BID) | ORAL | Status: DC

## 2013-01-07 MED ORDER — HYDROCODONE-ACETAMINOPHEN 7.5-325 MG PO TABS: 1.0000 | ORAL_TABLET | ORAL | Status: DC | PRN

## 2013-01-07 NOTE — Op Note (Signed)
NAME:  Jerry Mathews, Jerry Mathews NO.:  [id number]  MEDICAL RECORD NO.:  [id number]  LOCATION:  1607                         FACILITY:  [facility]  PHYSICIAN:  Madlyn Frankel. Charlann Boxer, M.D.  DATE OF BIRTH:  May 21, 1946  DATE OF PROCEDURE:  01/06/2013 DATE OF DISCHARGE:                              OPERATIVE REPORT   PREOPERATIVE DIAGNOSIS:  Persistently painful anterior knee following total knee replacement and revision surgery with concern of failed patella component based on workup.  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  PROCEDURE: 1. Revision of a left knee total knee patella. 2. Excision of fracture fragmented patella segment.  COMPONENTS USED:  DePuy patellar dome 38 mm in diameter.  See body of dictation for other findings.  SURGEON:  Madlyn Frankel. Charlann Boxer, M.D.  ASSISTANT:  Lanney Gins, PA-C.  Note that Jerry Mathews was present for the entirety of the case, preoperative position, perioperative management of the operative extremity, primary wound closure.  ANESTHESIA:  Preoperative regional femoral nerve block by Dr. Sherrian Divers as well as general anesthetic.  SPECIMENS:  None as the patient had clear synovial fluid.  COMPLICATIONS:  None.  DRAINS:  One medium Hemovac placed in left knee.  TOURNIQUET TIME:  36 minutes at 250 mmHg.  BLOOD LOSS:  Probably less than 150 mL due to tourniquet use.  INDICATION FOR PROCEDURE:  Jerry Mathews is a 67 year old gentleman who presented for evaluation of a persistently painful left knee and a revision knee replacement performed by an outside physician.  He had persistent recurring anterior knee pain.  Workup consisted of a bone scan revealed indications of an increased uptake around the patella component.  Based on the persistence of his pain complaints, his exam findings, and bone scan all correlating with anterior knee pain,  we discussed at length the options.  Options include at this point revision of the patella versus removal of the patellar component to prevent complications there off versus that of a patellectomy, which was not an option for [place] at this point to maintain anterior extensor mechanism power.  Risks of infection, persistent pain, failure of this technique, and the ultimate need for patellectomy were all reviewed.  Consent was obtained for benefit of pain relief.  PROCEDURE IN DETAIL:  The patient was brought to the operative theater. Once adequate anesthesia, preoperative antibiotics, 3 g of Ancef administered.  The patient was positioned supine with his left leg in a leg holder.  Left proximal thigh tourniquet was utilized.  Left lower extremity was then prepped and draped in usual sterile fashion.  Time- out was performed identifying the patient, planned procedure, and extremity.  The patient's old incision was incised and excised.  In the proximal portion was a widened scar.  Soft tissue plane was created. Median arthrotomy was carefully taken care in combination using the Bovie cautery, but then the knife around the polyethylene segment. Medial gutter was recreated excising the scar.  Attention was now directed to open up the lateral gutter to identify problems on the patella.  Partially elevating the extensor mechanism off the tubercle as well as debriding the  scar, I was able to get the patella to be partially everted.  I removed all scar tissue from this area identifying bony overgrowth on the medial and lateral aspects of the patella.  With the patella exposed after removing scar circumferentially around it, we began to assess for problems.  I did not see obvious loosening of the patella, however, with compression of the patella there was some expression of bone, which could have been coming from the anterior compression.  I then also identified using a caliper that the current  combined native bone and patella were at 30 mm in thickness.  Given these findings, I went ahead and used an oscillating saw and removed the patellar component eventually cutting down bone to a measurement of 15 mm with a caliper.  Upon debriding this bone as we made our cuts through the lateral side of the patella, we identified that there was patella fracture line evident in the superior lateral aspect of the patella.  This appeared to be different than a bipartite patella as there was no evidence of any pseudoarthrosis or any cartilage segment in this area. Given the fact that this was only about 10% to 15% of the size of the patella, I went ahead and excised this fragment allowing for further exposure of this lateral aspect of the patella for which I undermined the soft tissues around it and performed a lateral facetectomy.  I then downsized from a 41-38 patella, drilled lug holes, and measured thickness and it was back to about 25 mm thick poly, which was more reasonable for a male.  With 38 patellar button, the patella tracked through the trochlea without application of pressure.  Given all these findings and removal of bone, debridement of soft tissue scar as well as remaining cement and plastic from the old insert, I irrigated the wound with at this point 1500 mL of normal saline solution with pulse lavage. The final 38 patellar button was opened and cement mixed.  The final component was then cemented into position and held in place in compression until the cement fully cured.  Excessive cement was removed throughout the knee.  We re-irrigated the knee, tourniquet was let down after 36 minutes.  No significant or obvious hemostasis, just punctate bleeding from scar tissue.  A medium Hemovac drain was placed deep.  The extensor mechanism was then reapproximated with the knee in flexion using #1 Vicryl and 0 V-Loc suture.  The remainder of the wound was closed with 2-0 Vicryl  and running 3-0 Monocryl.  The knee was cleaned, dried, and dressed sterilely using Dermabond and Aquacel dressing.  Drain site dressed separately.  Note that Exparel 20 mL combined with 40 mL of normal saline solution was injected into the pericapsular tissues to support postoperative pain control.  The patient was then brought to recovery room with sterile bulky wrap in place.  Knee immobilizer due to femoral nerve block.  He will be weightbearing as tolerated, and work with physical therapy.     Madlyn Frankel Charlann Boxer, M.D.     MDO/MEDQ  D:  01/06/2013  T:  01/07/2013  Job:  [id number]

## 2013-01-07 NOTE — Progress Notes (Signed)
Physical Therapy Treatment Patient Details Name: DILON LANK MRN: [id number] DOB: 1946/02/18 Today's Date: 01/07/2013 Time: 0921-1000 PT Time Calculation (min): 39 min  PT Assessment / Plan / Recommendation Comments on Treatment Session  POD # 1 L patellar Revision planning to D/C to home today with spouse.  Perform stair training, gait training and TE's. Instructed on use of ICE.    Follow Up Recommendations  Home health PT     Does the patient have the potential to tolerate intense rehabilitation     Barriers to Discharge        Equipment Recommendations  None recommended by PT    Recommendations for Other Services    Frequency 7X/week   Plan      Precautions / Restrictions Precautions Precautions: Knee Precaution Comments: Instructed pt and spouse on KI use and when to D/C Required Braces or Orthoses: Knee Immobilizer - Left Knee Immobilizer - Left: Discontinue once straight leg raise with < 10 degree lag Restrictions Weight Bearing Restrictions: No LLE Weight Bearing: Weight bearing as tolerated   Pertinent Vitals/Pain C/o 3/10 "not bad" ICE applied after session    Mobility  Bed Mobility Bed Mobility: Not assessed Details for Bed Mobility Assistance: Pt OOB in recliner Transfers Transfers: Sit to Stand;Stand to Sit Sit to Stand: 6: Modified independent (Device/Increase time);From chair/3-in-1 Stand to Sit: 6: Modified independent (Device/Increase time);To chair/3-in-1 Details for Transfer Assistance: good use of hands and safety cognition Ambulation/Gait Ambulation/Gait Assistance: 5: Supervision Ambulation Distance (Feet): 15 Feet Assistive device: Rolling walker Ambulation/Gait Assistance Details: Instructed spouse on safe handling and had spouse amb pt to stairs while monitoring pt's gait.  Instructed pt and spouse on KI use for amb until pt able to perform 10 active SLR. Gait Pattern: Step-to pattern;Step-through pattern Stairs: Yes Stairs Assistance: 4:  Min guard Stair Management Technique: One rail Right;With crutches;Forwards Number of Stairs: 4    Exercises   Total Knee Replacement/Revision TE's 10 reps B LE ankle pumps 10 reps knee presses 10 reps heel slides  10 reps SAQ's 10 reps SLR's 10 reps ABD Followed by ICE   PT Goals                                                        progressing    Visit Information  Last PT Received On: 01/07/13 Assistance Needed: +1    Subjective Data  Subjective: I'm going home today   Cognition    good   Balance   good  End of Session PT - End of Session Equipment Utilized During Treatment: Gait belt;Left knee immobilizer Activity Tolerance: Patient tolerated treatment well Patient left: in chair;with call bell/phone within reach;with family/visitor present;with nursing in room Nurse Communication: Mobility status   Felecia Shelling  PTA [facility]  Acute  Rehab Pager      [phone]

## 2013-01-07 NOTE — Evaluation (Signed)
Occupational Therapy Evaluation Patient Details Name: Jerry Mathews MRN: [id number] DOB: Dec 06, 1945 Today's Date: 01/07/2013 Time: 4098-1191 OT Time Calculation (min): 27 min  OT Assessment / Plan / Recommendation Clinical Impression  This 67 year old man was admittedc for L patella revision.  All education was completed.  Pt does not need any further OT.    OT Assessment  Patient does not need any further OT services    Follow Up Recommendations  No OT follow up    Barriers to Discharge      Equipment Recommendations  None recommended by OT    Recommendations for Other Services    Frequency       Precautions / Restrictions Precautions Required Braces or Orthoses: Knee Immobilizer - Left Knee Immobilizer - Left: Discontinue once straight leg raise with < 10 degree lag Restrictions Weight Bearing Restrictions: No LLE Weight Bearing: Weight bearing as tolerated   Pertinent Vitals/Pain No c/o pain    ADL  Toilet Transfer: Minimal assistance Toilet Transfer Method: Sit to stand Tub/Shower Transfer: Min guard Tub/Shower Transfer Method: Science writer: Walk in shower Transfers/Ambulation Related to ADLs: ambulated to bathroom with min guard assist.  Min A for bed mobility ADL Comments: UB adls performed with set up; LB min to mod A without AE--pt has this and wife helped him with adls this am.  Pt wanted to review ADLs/bathroom transfers as it has been a couple of years since last surgery.  Equipment used KI and RW    OT Diagnosis:    OT Problem List:   OT Treatment Interventions:     OT Goals    Visit Information  Last OT Received On: 01/07/13 Assistance Needed: +1    Subjective Data  Subjective: You should see my dog.  He's something Patient Stated Goal: get back to being independent   Prior Functioning     Home Living Lives With: Spouse Bathroom Shower/Tub: Walk-in Stage manager: Standard Home Adaptive Equipment: Walker  - rolling;Crutches;Bedside commode/3-in-1;Straight cane Additional Comments: shower has seat in it Prior Function Level of Independence: Independent;Independent with assistive device(s) Communication Communication: No difficulties         Vision/Perception     Cognition  Cognition Overall Cognitive Status: Appears within functional limits for tasks assessed/performed Behavior During Session: [facility] for tasks performed    Extremity/Trunk Assessment Right Upper Extremity Assessment RUE ROM/Strength/Tone: [facility] for tasks assessed Left Upper Extremity Assessment LUE ROM/Strength/Tone: WFL for tasks assessed     Mobility Bed Mobility Supine to Sit: 4: Min assist Details for Bed Mobility Assistance: no cues needed Transfers Sit to Stand: 4: Min guard     Exercise     Balance     End of Session OT - End of Session Activity Tolerance: Patient tolerated treatment well Patient left: in chair;with call bell/phone within reach;with family/visitor present  GO     Dondra Rhett 01/07/2013, 9:20 AM Marica Otter, OTR/L [phone] 01/07/2013

## 2013-01-07 NOTE — Care Management Note (Signed)
    Page 1 of 1   01/07/2013     11:50:31 AM   CARE MANAGEMENT NOTE 01/07/2013  Patient:  Jerry Mathews, Jerry Mathews   Account Number:  [id number]  Date Initiated:  01/07/2013  Documentation initiated by:  Colleen Can  Subjective/Objective Assessment:   DX FAILED LEFT TOTal knee; revision of total knee on day of admission     Action/Plan:   home with hh pt services   Anticipated DC Date:  01/07/2013   Anticipated DC Plan:  HOME W HOME HEALTH SERVICES      DC Planning Services  CM consult      [facility] Choice  HOME HEALTH   Choice offered to / List presented to:          [facility] arranged  HH-2 PT      [facility] agency  [facility]   Status of service:  Completed, signed off Medicare Important Message given?   (If response is "NO", the following Medicare IM given date fields will be blank) Date Medicare IM given:   Date Additional Medicare IM given:    Discharge Disposition:  HOME W HOME HEALTH SERVICES  Per UR Regulation:  Reviewed for med. necessity/level of care/duration of stay  If discussed at Long Length of Stay Meetings, dates discussed:    Comments:  01/07/2013 Colleen Can BSN RN CCM [phone] Pt discharged prior to being seen by CM.

## 2013-01-07 NOTE — Progress Notes (Signed)
Patient ID: Jerry Mathews, male   DOB: 11-14-1945, 67 y.o.   MRN: [id number] Subjective: 1 Day Post-Op Procedure(s) (LRB): PATELLA COMPONENT REVISION / removal and replacement of patella component  (Left)    Patient reports pain as mild.  But still with FNB effects, ie neg straight leg raise  Objective:   VITALS:   Filed Vitals:   01/07/13 0637  BP: 129/69  Pulse: 55  Temp: 98.3 F (36.8 C)  Resp: 14    Incision: dressing C/D/I, HV drain removed Neg straight leg raise on exam  LABS  Recent Labs  01/07/13 0449  HGB 12.0*  HCT 34.9*  WBC 8.9  PLT 216     Recent Labs  01/07/13 0449  NA 136  K 4.1  BUN 11  CREATININE 0.66  GLUCOSE 133*    No results found for this basename: LABPT, INR,  in the last 72 hours   Assessment/Plan: 1 Day Post-Op Procedure(s) (LRB): PATELLA COMPONENT REVISION / removal and replacement of patella component  (Left)   Up with therapy, knee immobilizer on until can perform straight leg raises D/C IV fluids Discharge home with home health after therapy today  RTC 2 weeks Reviewed findings with him today

## 2013-01-09 NOTE — Discharge Summary (Signed)
Physician Discharge Summary  Patient ID: Jerry Mathews MRN: [id number] DOB/AGE: 1946/09/18 67 y.o.  Admit date: 01/06/2013 Discharge date: 01/07/2013   Procedures:  Procedure(s) (LRB): PATELLA COMPONENT REVISION / removal and replacement of patella component  (Left)  Attending Physician:  Dr. Durene Romans   Admission Diagnoses:   Left knee pain S/P total knee arthroplasty  Discharge Diagnoses:  Principal Problem:   S/P left patella revision  Diagnosis  . Colon polyps - denomatous  . Diverticulosis  . Anxiety  . Asthma  . Depression  . Hypertension  . Thyroid disease  . Prostate cancer  . Neuropathy - feet  . Headache  . Arthritis  . Bruises easily  . Sleep apnea - USES C-PAP    HPI: Pt is a 67 y.o. male complaining of left knee pain since 2005. He originally hurt his knee in 2005, progressive pain eventually lead to the total knee arthroplasty in 2008. Since he has had other procedures including a manipulation and a second surgery to replace the patella component of the left knee. Pain had continually increased since the last revision which was in 2012. X-rays in the clinic show previous total knee arthroplasty with a very thin patella. Pt has tried various conservative treatments which have failed to alleviate their symptoms, including modifications, NSAIDs, ice and pain medications. Various options are discussed with the patient. Risks, benefits and expectations were discussed with the patient. Patient understand the risks, benefits and expectations and wishes to proceed with surgery.  PCP: Oliver Barre, MD   Discharged Condition: good  Hospital Course:  Patient underwent the above stated procedure on 01/06/2013. Patient tolerated the procedure well and brought to the recovery room in good condition and subsequently to the floor.  POD #1 BP: 129/69 ; Pulse: 55 ; Temp: 98.3 F (36.8 C) ; Resp: 14  Pt's foley was removed, as well as the hemovac drain removed. IV was changed  to a saline lock. Patient reports pain as mild. But still with FNB effects, ie neg straight leg raise. Later in the day was feeling better, pain well controlled and felt like wanting to go home. Neurovascular intact, dorsiflexion/plantar flexion intact, incision: dressing C/D/I, no cellulitis present and compartment soft.   LABS  Basename  01/07/13    0449   HGB  12.0  HCT  34.9    Discharge Exam: General appearance: alert, cooperative and no distress Extremities: Homans sign is negative, no sign of DVT, no edema, redness or tenderness in the calves or thighs and no ulcers, gangrene or trophic changes  Disposition:   Home-Health Care Svc with follow up in 2 weeks   Follow-up Information   Follow up with [name], MD. Schedule an appointment as soon as possible for a visit in 2 weeks.   Contact information:   [address] [place] [address] [phone]       Discharge Orders   Future Orders Complete By Expires     Call MD / Call 911  As directed     Comments:      If you experience chest pain or shortness of breath, CALL 911 and be transported to the hospital emergency room.  If you develope a fever above 101 F, pus (white drainage) or increased drainage or redness at the wound, or calf pain, call your surgeon's office.    Change dressing  As directed     Comments:      Maintain surgical dressing for 10-14 days,  then change the dressing daily with sterile 4 x 4 inch gauze dressing and tape. Keep the area dry and clean.    Constipation Prevention  As directed     Comments:      Drink plenty of fluids.  Prune juice may be helpful.  You may use a stool softener, such as Colace (over the counter) 100 mg twice a day.  Use MiraLax (over the counter) for constipation as needed.    Diet - low sodium heart healthy  As directed     Discharge instructions  As directed     Comments:      Maintain surgical dressing for 10-14 days, then replace with gauze and tape.  Keep the area dry and clean until follow up. Follow up in 2 weeks at [facility]. Call with any questions or concerns.    Increase activity slowly as tolerated  As directed     TED hose  As directed     Comments:      Use stockings (TED hose) for 2 weeks on both leg(s).  You may remove them at night for sleeping.    Weight bearing as tolerated  As directed          Medication List    TAKE these medications       albuterol 108 (90 BASE) MCG/ACT inhaler  Commonly known as:  PROVENTIL HFA;VENTOLIN HFA  Inhale 1-2 puffs into the lungs every 6 (six) hours as needed for wheezing or shortness of breath.     aspirin EC 325 MG tablet  Take 1 tablet (325 mg total) by mouth 2 (two) times daily.     DSS 100 MG Caps  Take 100 mg by mouth 2 (two) times daily.     ferrous sulfate 325 (65 FE) MG tablet  Take 1 tablet (325 mg total) by mouth 3 (three) times daily after meals.     gabapentin 400 MG capsule  Commonly known as:  NEURONTIN  Take 400-800 mg by mouth 3 (three) times daily. Takes 1 in the morning 1 at noon and 2 at bedtime     HYDROcodone-acetaminophen 7.5-325 MG per tablet  Commonly known as:  NORCO  Take 1-2 tablets by mouth every 4 (four) hours as needed for pain.     levothyroxine 88 MCG tablet  Commonly known as:  SYNTHROID, LEVOTHROID  Take 88 mcg by mouth daily before breakfast.     lisinopril 10 MG tablet  Commonly known as:  PRINIVIL,ZESTRIL  Take 10 mg by mouth daily before breakfast.     nortriptyline 10 MG capsule  Commonly known as:  PAMELOR  Take 20 mg by mouth at bedtime.     pilocarpine 5 MG tablet  Commonly known as:  SALAGEN  Take 5 mg by mouth 4 (four) times daily.     polyethylene glycol packet  Commonly known as:  MIRALAX / GLYCOLAX  Take 17 g by mouth 2 (two) times daily.     tiZANidine 4 MG capsule  Commonly known as:  ZANAFLEX  Take 1 capsule (4 mg total) by mouth 3 (three) times daily. Muscle spasms     traZODone 50 MG tablet    Commonly known as:  DESYREL  Take 50 mg by mouth at bedtime.     venlafaxine XR 150 MG 24 hr capsule  Commonly known as:  EFFEXOR-XR  Take 150 mg by mouth every evening.         Signed: Anastasio Auerbach. Tallen Schnorr  PAC  01/09/2013, 4:51 PM

## 2013-08-18 ENCOUNTER — Encounter (HOSPITAL_BASED_OUTPATIENT_CLINIC_OR_DEPARTMENT_OTHER): Payer: Self-pay | Admitting: *Deleted

## 2013-08-19 ENCOUNTER — Encounter (HOSPITAL_BASED_OUTPATIENT_CLINIC_OR_DEPARTMENT_OTHER): Payer: Self-pay | Admitting: *Deleted

## 2013-08-19 NOTE — Progress Notes (Signed)
NPO AFTER MN. ARRIVE AT 0630. NEEDS ISTAT . CURRENT EKG IN EPIC AND CHART. WILL TAKE SYNTHROID AND GABAPENTIN AM DOS W/ SIPS OF WATER.

## 2013-08-21 ENCOUNTER — Ambulatory Visit (HOSPITAL_BASED_OUTPATIENT_CLINIC_OR_DEPARTMENT_OTHER): Payer: Medicare HMO | Admitting: Anesthesiology

## 2013-08-21 ENCOUNTER — Encounter (HOSPITAL_BASED_OUTPATIENT_CLINIC_OR_DEPARTMENT_OTHER): Payer: Self-pay | Admitting: *Deleted

## 2013-08-21 ENCOUNTER — Encounter (HOSPITAL_BASED_OUTPATIENT_CLINIC_OR_DEPARTMENT_OTHER)
Admission: RE | Disposition: A | Discharge status: Home / Self Care | Payer: Self-pay | Source: Ambulatory Visit | Attending: Orthopedic Surgery

## 2013-08-21 ENCOUNTER — Encounter (HOSPITAL_BASED_OUTPATIENT_CLINIC_OR_DEPARTMENT_OTHER): Payer: Medicare HMO | Admitting: Anesthesiology

## 2013-08-21 ENCOUNTER — Ambulatory Visit (HOSPITAL_BASED_OUTPATIENT_CLINIC_OR_DEPARTMENT_OTHER)
Admission: RE | Admit: 2013-08-21 | Discharge: 2013-08-21 | Disposition: A | Discharge status: Home / Self Care | Payer: Medicare HMO | Source: Ambulatory Visit | Attending: Orthopedic Surgery | Admitting: Orthopedic Surgery

## 2013-08-21 DIAGNOSIS — I10 Essential (primary) hypertension: Secondary | ICD-10-CM | POA: Insufficient documentation

## 2013-08-21 DIAGNOSIS — X58XXXA Exposure to other specified factors, initial encounter: Secondary | ICD-10-CM | POA: Insufficient documentation

## 2013-08-21 DIAGNOSIS — C61 Malignant neoplasm of prostate: Secondary | ICD-10-CM | POA: Insufficient documentation

## 2013-08-21 DIAGNOSIS — S83289A Other tear of lateral meniscus, current injury, unspecified knee, initial encounter: Secondary | ICD-10-CM | POA: Insufficient documentation

## 2013-08-21 DIAGNOSIS — Z79899 Other long term (current) drug therapy: Secondary | ICD-10-CM | POA: Insufficient documentation

## 2013-08-21 DIAGNOSIS — IMO0002 Reserved for concepts with insufficient information to code with codable children: Secondary | ICD-10-CM | POA: Insufficient documentation

## 2013-08-21 DIAGNOSIS — M1710 Unilateral primary osteoarthritis, unspecified knee: Secondary | ICD-10-CM | POA: Insufficient documentation

## 2013-08-21 DIAGNOSIS — G4733 Obstructive sleep apnea (adult) (pediatric): Secondary | ICD-10-CM | POA: Insufficient documentation

## 2013-08-21 DIAGNOSIS — M2240 Chondromalacia patellae, unspecified knee: Secondary | ICD-10-CM | POA: Insufficient documentation

## 2013-08-21 DIAGNOSIS — E039 Hypothyroidism, unspecified: Secondary | ICD-10-CM | POA: Insufficient documentation

## 2013-08-21 DIAGNOSIS — J45909 Unspecified asthma, uncomplicated: Secondary | ICD-10-CM | POA: Insufficient documentation

## 2013-08-21 HISTORY — DX: Hypothyroidism, unspecified: E03.9

## 2013-08-21 HISTORY — DX: Osteoarthritis of knee, unspecified: M17.9

## 2013-08-21 HISTORY — PX: KNEE ARTHROSCOPY WITH MEDIAL MENISECTOMY: SHX5651

## 2013-08-21 HISTORY — DX: Obstructive sleep apnea (adult) (pediatric): G47.33

## 2013-08-21 HISTORY — PX: CHONDROPLASTY: SHX5177

## 2013-08-21 HISTORY — DX: Personal history of colonic polyps: Z86.010

## 2013-08-21 HISTORY — DX: Unspecified tear of unspecified meniscus, current injury, right knee, initial encounter: S83.206A

## 2013-08-21 HISTORY — DX: Personal history of colon polyps, unspecified: Z86.0100

## 2013-08-21 HISTORY — DX: Unspecified mononeuropathy of bilateral lower limbs: G57.93

## 2013-08-21 HISTORY — DX: Dependence on other enabling machines and devices: Z99.89

## 2013-08-21 HISTORY — DX: Unilateral primary osteoarthritis, unspecified knee: M17.10

## 2013-08-21 HISTORY — PX: KNEE ARTHROSCOPY WITH LATERAL MENISECTOMY: SHX6193

## 2013-08-21 LAB — POCT I-STAT 4, (NA,K, GLUC, HGB,HCT)
Glucose, Bld: 102 mg/dL — ABNORMAL HIGH (ref 70–99)
HCT: 47 % (ref 39.0–52.0)
Hemoglobin: 16 g/dL (ref 13.0–17.0)
Potassium: 4.3 mEq/L (ref 3.5–5.1)
Sodium: 138 mEq/L (ref 135–145)

## 2013-08-21 SURGERY — ARTHROSCOPY, KNEE, WITH MEDIAL MENISCECTOMY
Anesthesia: General | Site: Knee | Laterality: Right | Wound class: Clean

## 2013-08-21 MED ORDER — METHOCARBAMOL 500 MG PO TABS: 500.0000 mg | ORAL_TABLET | Freq: Four times a day (QID) | ORAL | Status: DC

## 2013-08-21 MED ORDER — LACTATED RINGERS IR SOLN
Status: DC | PRN
  Administered 2013-08-21: 6000 mL

## 2013-08-21 MED ORDER — LIDOCAINE-EPINEPHRINE (PF) 1 %-1:200000 IJ SOLN
INTRAMUSCULAR | Status: DC | PRN
  Administered 2013-08-21: 25 mL

## 2013-08-21 MED ORDER — FENTANYL CITRATE 0.05 MG/ML IJ SOLN
25.0000 ug | INTRAMUSCULAR | Status: DC | PRN
  Filled 2013-08-21: qty 1

## 2013-08-21 MED ORDER — FENTANYL CITRATE 0.05 MG/ML IJ SOLN
INTRAMUSCULAR | Status: DC | PRN
  Administered 2013-08-21: 50 ug via INTRAVENOUS
  Administered 2013-08-21 (×2): 25 ug via INTRAVENOUS

## 2013-08-21 MED ORDER — ASPIRIN EC 325 MG PO TBEC: 325.0000 mg | DELAYED_RELEASE_TABLET | Freq: Every day | ORAL | Status: DC

## 2013-08-21 MED ORDER — LACTATED RINGERS IV SOLN
INTRAVENOUS | Status: DC
  Administered 2013-08-21: 07:00:00 via INTRAVENOUS
  Filled 2013-08-21: qty 1000

## 2013-08-21 MED ORDER — LIDOCAINE HCL (CARDIAC) 20 MG/ML IV SOLN
INTRAVENOUS | Status: DC | PRN
  Administered 2013-08-21: 100 mg via INTRAVENOUS

## 2013-08-21 MED ORDER — HYDROCODONE-ACETAMINOPHEN 5-325 MG PO TABS: 1.0000 | ORAL_TABLET | Freq: Four times a day (QID) | ORAL | Status: DC | PRN

## 2013-08-21 MED ORDER — PROPOFOL 10 MG/ML IV BOLUS
INTRAVENOUS | Status: DC | PRN
  Administered 2013-08-21: 300 mg via INTRAVENOUS

## 2013-08-21 MED ORDER — MELOXICAM 7.5 MG PO TABS: 15.0000 mg | ORAL_TABLET | Freq: Every day | ORAL | Status: DC

## 2013-08-21 MED ORDER — KETOROLAC TROMETHAMINE 30 MG/ML IJ SOLN
INTRAMUSCULAR | Status: DC | PRN
  Administered 2013-08-21: 30 mg via INTRAVENOUS

## 2013-08-21 MED ORDER — DEXAMETHASONE SODIUM PHOSPHATE 4 MG/ML IJ SOLN
INTRAMUSCULAR | Status: DC | PRN
  Administered 2013-08-21: 10 mg via INTRAVENOUS

## 2013-08-21 MED ORDER — PROMETHAZINE HCL 25 MG/ML IJ SOLN
6.2500 mg | INTRAMUSCULAR | Status: DC | PRN
  Filled 2013-08-21: qty 1

## 2013-08-21 MED ORDER — CEFAZOLIN SODIUM-DEXTROSE 2-3 GM-% IV SOLR
2.0000 g | INTRAVENOUS | Status: AC
  Administered 2013-08-21: 2 g via INTRAVENOUS
  Filled 2013-08-21: qty 50

## 2013-08-21 MED ORDER — ALBUTEROL SULFATE (5 MG/ML) 0.5% IN NEBU
2.5000 mg | INHALATION_SOLUTION | Freq: Once | RESPIRATORY_TRACT | Status: AC
  Administered 2013-08-21: 2.5 mg via RESPIRATORY_TRACT
  Filled 2013-08-21: qty 0.5

## 2013-08-21 MED ORDER — BUPIVACAINE-EPINEPHRINE 0.25% -1:200000 IJ SOLN
INTRAMUSCULAR | Status: DC | PRN
  Administered 2013-08-21: 25 mL

## 2013-08-21 MED ORDER — ONDANSETRON HCL 4 MG/2ML IJ SOLN
INTRAMUSCULAR | Status: DC | PRN
  Administered 2013-08-21: 4 mg via INTRAVENOUS

## 2013-08-21 SURGICAL SUPPLY — 31 items
BANDAGE ELASTIC 6 VELCRO ST LF (GAUZE/BANDAGES/DRESSINGS) ×2 IMPLANT
BLADE 4.2CUDA (BLADE) IMPLANT
BLADE CUDA SHAVER 3.5 (BLADE) ×2 IMPLANT
CANISTER SUCT LVC 12 LTR MEDI- (MISCELLANEOUS) ×2 IMPLANT
CANISTER SUCTION 2500CC (MISCELLANEOUS) ×2 IMPLANT
CLOTH BEACON ORANGE TIMEOUT ST (SAFETY) ×2 IMPLANT
DRAPE ARTHROSCOPY W/POUCH 114 (DRAPES) ×2 IMPLANT
DRSG EMULSION OIL 3X3 NADH (GAUZE/BANDAGES/DRESSINGS) ×2 IMPLANT
DRSG PAD ABDOMINAL 8X10 ST (GAUZE/BANDAGES/DRESSINGS) ×2 IMPLANT
DURAPREP 26ML APPLICATOR (WOUND CARE) ×2 IMPLANT
ELECT MENISCUS 165MM 90D (ELECTRODE) IMPLANT
ELECT REM PT RETURN 9FT ADLT (ELECTROSURGICAL)
ELECTRODE REM PT RTRN 9FT ADLT (ELECTROSURGICAL) IMPLANT
GLOVE BIO SURGEON STRL SZ7.5 (GLOVE) ×2 IMPLANT
GLOVE BIOGEL PI IND STRL 7.5 (GLOVE) ×3 IMPLANT
GLOVE BIOGEL PI INDICATOR 7.5 (GLOVE) ×3
GOWN PREVENTION PLUS LG XLONG (DISPOSABLE) ×2 IMPLANT
IV LACTATED RINGER IRRG 3000ML (IV SOLUTION) ×4
IV LR IRRIG 3000ML ARTHROMATIC (IV SOLUTION) ×2 IMPLANT
KNEE WRAP E Z 3 GEL PACK (MISCELLANEOUS) ×2 IMPLANT
PACK ARTHROSCOPY DSU (CUSTOM PROCEDURE TRAY) ×2 IMPLANT
PACK BASIN DAY SURGERY FS (CUSTOM PROCEDURE TRAY) ×2 IMPLANT
PADDING CAST COTTON 6X4 STRL (CAST SUPPLIES) ×2 IMPLANT
PENCIL BUTTON HOLSTER BLD 10FT (ELECTRODE) IMPLANT
SET ARTHROSCOPY TUBING (MISCELLANEOUS) ×2
SET ARTHROSCOPY TUBING LN (MISCELLANEOUS) ×1 IMPLANT
SPONGE GAUZE 4X4 12PLY (GAUZE/BANDAGES/DRESSINGS) ×2 IMPLANT
SUT ETHILON 4 0 PS 2 18 (SUTURE) ×2 IMPLANT
SYR CONTROL 10ML LL (SYRINGE) ×2 IMPLANT
TOWEL OR 17X24 6PK STRL BLUE (TOWEL DISPOSABLE) ×2 IMPLANT
WATER STERILE IRR 500ML POUR (IV SOLUTION) ×2 IMPLANT

## 2013-08-21 NOTE — Anesthesia Postprocedure Evaluation (Signed)
  Anesthesia Post-op Note  Patient: Jerry Mathews  Procedure(s) Performed: Procedure(s) (LRB): KNEE ARTHROSCOPY WITH MEDIAL MENISECTOMY (Right) KNEE ARTHROSCOPY WITH LATERAL MENISECTOMY (Right)  MEDIAL PETELLAR CHONDROPLASTY (Right)  Patient Location: PACU  Anesthesia Type: General  Level of Consciousness: awake and alert   Airway and Oxygen Therapy: Patient Spontanous Breathing  Post-op Pain: mild  Post-op Assessment: Post-op Vital signs reviewed, Patient's Cardiovascular Status Stable, Respiratory Function Stable, Patent Airway and No signs of Nausea or vomiting  Last Vitals:  Filed Vitals:   08/21/13 1000  BP: 111/75  Pulse: 64  Temp:   Resp: 18    Post-op Vital Signs: stable   Complications: No apparent anesthesia complications

## 2013-08-21 NOTE — Transfer of Care (Signed)
Immediate Anesthesia Transfer of Care Note  Patient: Jerry Mathews  Procedure(s) Performed: Procedure(s) (LRB): KNEE ARTHROSCOPY WITH MEDIAL MENISECTOMY (Right) KNEE ARTHROSCOPY WITH LATERAL MENISECTOMY (Right)  MEDIAL PETELLAR CHONDROPLASTY (Right)  Patient Location: PACU  Anesthesia Type: General  Level of Consciousness: awake, oriented, sedated and patient cooperative  Airway & Oxygen Therapy: Patient Spontanous Breathing and Patient connected to face mask oxygen  Post-op Assessment: Report given to PACU RN and Post -op Vital signs reviewed and stable  Post vital signs: Reviewed and stable  Complications: No apparent anesthesia complications

## 2013-08-21 NOTE — Brief Op Note (Signed)
08/21/2013  12:24 PM  PATIENT:  Jerry Mathews  67 y.o. male  PRE-OPERATIVE DIAGNOSIS:  RIGHT KNEE MEDIAL and lateral MENISCal tears and chondromalacia  POST-OPERATIVE DIAGNOSIS:  1.  Medial and lateral meniscal tearing, 2. 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 Medial and patellofemoral chondroplasty  SURGEON:  Surgeon(s) and Role:    * [name], MD - Primary  PHYSICIAN ASSISTANT: None  ANESTHESIA:   general  EBL:  Total I/O In: 1520 [P.O.:720; I.V.:800] Out: -   BLOOD ADMINISTERED:none  DRAINS: none   LOCAL MEDICATIONS USED:  MARCAINE     SPECIMEN:  No Specimen  DISPOSITION OF SPECIMEN:  N/A  COUNTS:  YES  TOURNIQUET:  * No tourniquets in log *  DICTATION: .Other Dictation: Dictation Number [id number]  PLAN OF CARE: Discharge to home after PACU  PATIENT DISPOSITION:  PACU - hemodynamically stable.   Delay start of Pharmacological VTE agent (>24hrs) due to surgical blood loss or risk of bleeding: not applicable

## 2013-08-21 NOTE — H&P (Signed)
CC- Jerry Mathews is a 67 y.o. male who presents with right knee pain.  He has recent history of left knee revision presenting now for continued conservative care of his right knee  HPI- . Knee Pain: Patient presents for follow up on a knee problem involving the  right knee. Onset of the symptoms was several months ago. Inciting event: none known. Current symptoms include crepitus sensation, foreign body sensation, giving out, pain located meidal aspect of joint and swelling. Pain is aggravated by any weight bearing, kneeling, lateral movements and walking.  Patient has had no prior knee problems. Evaluation to date: plain films: no significant change from prior studies and MRI: abnormal medial and lateral meniscal tearing associated with degenerative arthritic changes as well. Treatment to date: avoidance of offending activity, corticosteroid injection which was not very effective, prescription NSAIDS which are not very effective and PT which was ineffective.  Past Medical History  Diagnosis Date  . Diverticulosis   . Anxiety   . Asthma   . Depression   . Hypertension   . Arthritis   . Hypothyroidism   . Neuropathy of both feet   . OSA on CPAP   . History of colon polyps     ADENOMATOUS  . OA (osteoarthritis) of knee     RIGHT  . Right knee meniscal tear   . Prostate cancer     [facility] [place]    Past Surgical History  Procedure Laterality Date  . Total knee arthroplasty Left 11-12-2006    AND 02-05-2007  CLOSED MANIPULATION LEFT KNEE  . Knee arthroscopy Left 06-03-2004  &  07-12-2006    DEBRIDEMENT OF MENISCUS, PARTIAL ACL TEAR AND CHONDROMALACIA  . Mandible reconstruction  1976  . Hand surgery Left 2009  . Total knee revision Left 01/06/2013    Procedure: PATELLA COMPONENT REVISION / removal and replacement of patella component ;  Surgeon: [name], MD;  Location: [facility];  Service: Orthopedics;  Laterality: Left;  pt. also has a femoral nerve block in left leg.   . Revision patella of left total knee  05-01-2011    Prior to Admission medications   Medication Sig Start Date End Date Taking? Authorizing Provider  albuterol (PROVENTIL HFA;VENTOLIN HFA) 108 (90 BASE) MCG/ACT inhaler Inhale 1-2 puffs into the lungs every 6 (six) hours as needed for wheezing or shortness of breath.    Yes Historical Provider, MD  gabapentin (NEURONTIN) 300 MG capsule Take 600-900 mg by mouth 2 (two) times daily. 600MG   AM/   900MG  HS   Yes Historical Provider, MD  gabapentin (NEURONTIN) 400 MG capsule Take 400 mg by mouth daily at 12 noon. TAKES THIS MED TID  400MG  AT NOON/  600MG  AM /  900MG  PM   Yes Historical Provider, MD  levothyroxine (SYNTHROID, LEVOTHROID) 88 MCG tablet Take 88 mcg by mouth daily before breakfast.    Yes Historical Provider, MD  lisinopril (PRINIVIL,ZESTRIL) 10 MG tablet Take 10 mg by mouth daily before breakfast.    Yes Historical Provider, MD  nortriptyline (PAMELOR) 50 MG capsule Take 50 mg by mouth at bedtime.   Yes Historical Provider, MD  pilocarpine (SALAGEN) 5 MG tablet Take 5 mg by mouth 4 (four) times daily.   Yes Historical Provider, MD  traZODone (DESYREL) 50 MG tablet Take 50 mg by mouth at bedtime.   Yes Historical Provider, MD  Docusate Sodium (DSS) 100 MG CAPS Take 100 mg by mouth 2 (two) times daily.  Genelle Gather Babish, PA-C  HYDROcodone-acetaminophen (NORCO) 7.5-325 MG per tablet Take 1-2 tablets by mouth every 4 (four) hours as needed for pain. 01/07/13   Genelle Gather Babish, PA-C  polyethylene glycol [facility] / GLYCOLAX) packet Take 17 g by mouth daily.    Genelle Gather Babish, PA-C    antalgic gait, soft tissue tenderness over medial and lateral aspect of knee, effusion, reduced range of motion, collateral ligaments intact, normal ipsilateral hip exam  Physical Examination: General appearance - alert, well appearing, and in no distress Mental status - alert, oriented to person, place, and time Chest - clear to auscultation,  no wheezes, rales or rhonchi, symmetric air entry Heart - normal rate and regular rhythm Abdomen - soft, nontender, nondistended, no masses or organomegaly Musculoskeletal - see above findings Extremities - peripheral pulses normal, no pedal edema, no clubbing or cyanosis Skin - normal coloration and turgor, no rashes, no suspicious skin lesions noted   Asessment/Plan--- Right knee medial and lateral meniscal tearing associated with degenerative joint changes   Plan right knee arthroscopy with meniscal debridement. Procedure risks and potential comps discussed with patient who elects to proceed. Goals are decreased pain and increased function with a high likelihood of achieving both

## 2013-08-21 NOTE — Progress Notes (Addendum)
Dr. Okey Dupre informed of admission O2 Sat .87%.  Now 92.  No inhaler used prior to arrival.  [facility] given per order.

## 2013-08-21 NOTE — Anesthesia Procedure Notes (Signed)
Procedure Name: LMA Insertion Date/Time: 08/21/2013 8:44 AM Performed by: Renella Cunas D Pre-anesthesia Checklist: Patient identified, Emergency Drugs available, Suction available and Patient being monitored Patient Re-evaluated:Patient Re-evaluated prior to inductionOxygen Delivery Method: Circle System Utilized Preoxygenation: Pre-oxygenation with 100% oxygen Intubation Type: IV induction Ventilation: Mask ventilation without difficulty LMA: LMA inserted LMA Size: 5.0 Number of attempts: 1 Airway Equipment and Method: bite block Placement Confirmation: positive ETCO2 Tube secured with: Tape Dental Injury: Teeth and Oropharynx as per pre-operative assessment

## 2013-08-21 NOTE — Anesthesia Preprocedure Evaluation (Addendum)
Anesthesia Evaluation  Patient identified by MRN, date of birth, ID band Patient awake    Reviewed: Allergy & Precautions, H&P , NPO status , Patient's Chart, lab work & pertinent test results  Airway Mallampati: II TM Distance: >3 FB Neck ROM: Full    Dental no notable dental hx.    Pulmonary asthma , sleep apnea , COPDformer smoker,    + wheezing      Cardiovascular hypertension, Pt. on medications Rhythm:Regular Rate:Normal     Neuro/Psych negative neurological ROS  negative psych ROS   GI/Hepatic negative GI ROS, Neg liver ROS,   Endo/Other  Hypothyroidism   Renal/GU negative Renal ROS  negative genitourinary   Musculoskeletal negative musculoskeletal ROS (+)   Abdominal (+) + obese,   Peds negative pediatric ROS (+)  Hematology negative hematology ROS (+)   Anesthesia Other Findings   Reproductive/Obstetrics negative OB ROS                          Anesthesia Physical Anesthesia Plan  ASA: III  Anesthesia Plan: General   Post-op Pain Management:    Induction: Intravenous  Airway Management Planned: LMA  Additional Equipment:   Intra-op Plan:   Post-operative Plan:   Informed Consent: I have reviewed the patients History and Physical, chart, labs and discussed the procedure including the risks, benefits and alternatives for the proposed anesthesia with the patient or authorized representative who has indicated his/her understanding and acceptance.   Dental advisory given  Plan Discussed with: CRNA and Surgeon  Anesthesia Plan Comments:         Anesthesia Quick Evaluation

## 2013-08-22 NOTE — Op Note (Signed)
NAME:  Jerry Mathews NO.:  [id number]  MEDICAL RECORD NO.:  [id number]  LOCATION:                                 FACILITY:  PHYSICIAN:  Jerry Mathews, M.D.  DATE OF BIRTH:  [birth year]  DATE OF PROCEDURE:  08/21/2013 DATE OF DISCHARGE:                              OPERATIVE REPORT   PREOPERATIVE DIAGNOSIS:  Right knee medial and lateral meniscal tear.  POSTOPERATIVE DIAGNOSES/FINDINGS: 1. Posterior horn medial meniscal tear. 2. Anterior horn midbody as well as posterior horn lateral meniscal     tear. 3. 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  PROCEDURE: 1. Right knee diagnostic and operative arthroscopy with medial and     lateral partial meniscectomies. 2. Medial and patellofemoral chondroplasty with debridement of     chondral flaps.  No evidence of eburnated bone. 3. Microfracture abrasion chondroplasty required.  SURGEON:  Jerry Mathews, M.D.  ASSISTANT:  Surgical team.  ANESTHESIA:  General plus perioperative local block infiltrated at the portal sites plus intra-articular at the end of the case.  COMPLICATIONS:  None.  DRAINS:  None.  PATHOLOGY:  None.  INDICATIONS FOR PROCEDURE:  Jerry Mathews is a 67 year old male who presented to the office in followup from a previously performed left knee surgery.  He had mechanical symptoms with radiographic preservation of joint space.  MRI revealed meniscal pathology.  Given the persistence and recurrent symptoms, he wished at this point to proceed with surgical intervention in the form of arthroscopic surgery.  He had been through this before with his left knee.  Risks of infection, DVT, persistent pain, risks of potential progressive arthritis, recurrent meniscal pathology were all reviewed.  Consent was obtained for benefit of pain relief.  PROCEDURE IN DETAIL:  The patient was brought to the operative theater. Once adequate anesthesia, preoperative  antibiotics, Ancef administered. The patient was positioned supine with his right leg in a leg holder. Right lower extremity was then prepped and draped in a sterile fashion. Time-out was performed identifying the patient, planned procedure, and extremity.  Standard inferomedial, superomedial, inferolateral portals were utilized.  Diagnostic evaluation of the knee revealed the above findings.  The inferior medial portal was utilized as a sole working portal.  The medial compartment was addressed first.  Utilizing a combination of 3.5 Cuda shaver and straight biting basket, the meniscus was debrided off the posterior wall of the meniscus.  Subsequently I used the 3.5 Cuda shaver to debride the chondral flaps on the medial femoral condyle, but again no either evidence of eburnated bone.  Following this, attention was directed to the lateral compartment where I used again a combination of 3.5 Cuda shaver and the straight-biting basket to debride the lateral meniscus.  The cartilage surfaces were relatively intact.  Anteriorly after performing a bit of the synovectomy, then I was able to debride back in the chondral flaps with the 3.5 Cuda shaver without evidence of any eburnated bone.  Following this, I re-examined the knee to make certain I was satisfied with the above-stated procedures.  Once I identified that the meniscus was stable, there was no evidence any  cartilage fragments in the knee.  The instrumentation was removed from the knee.  Portal sites were reapproximated using a 4-0 nylon.  The knee was injected at the end of the case with 0.25% Marcaine with epinephrine.  He was then brought to the recovery room in stable condition tolerating the procedure well.  Findings will be reviewed with he and his wife at the office.  He will be set up with physical therapy as needed.     Jerry Mathews, M.D.     MDO/MEDQ  D:  08/21/2013  T:  08/22/2013  Job:  [id number]

## 2013-08-25 ENCOUNTER — Encounter (HOSPITAL_BASED_OUTPATIENT_CLINIC_OR_DEPARTMENT_OTHER): Payer: Self-pay | Admitting: Orthopedic Surgery

## 2014-01-12 NOTE — Progress Notes (Signed)
Surgery on 01/19/14.  Need orders in EPIC.

## 2014-01-13 ENCOUNTER — Encounter (HOSPITAL_COMMUNITY): Payer: Self-pay

## 2014-01-13 ENCOUNTER — Encounter (HOSPITAL_COMMUNITY): Payer: Self-pay | Admitting: Pharmacy Technician

## 2014-01-13 ENCOUNTER — Encounter (INDEPENDENT_AMBULATORY_CARE_PROVIDER_SITE_OTHER): Payer: Self-pay

## 2014-01-13 ENCOUNTER — Encounter (HOSPITAL_COMMUNITY)
Admission: RE | Admit: 2014-01-13 | Discharge: 2014-01-13 | Disposition: A | Discharge status: Home / Self Care | Payer: Medicare Other | Source: Ambulatory Visit | Attending: Orthopedic Surgery | Admitting: Orthopedic Surgery

## 2014-01-13 ENCOUNTER — Ambulatory Visit (HOSPITAL_COMMUNITY)
Admission: RE | Admit: 2014-01-13 | Discharge: 2014-01-13 | Disposition: A | Discharge status: Home / Self Care | Payer: Medicare Other | Source: Ambulatory Visit | Attending: Anesthesiology | Admitting: Anesthesiology

## 2014-01-13 DIAGNOSIS — G9589 Other specified diseases of spinal cord: Secondary | ICD-10-CM | POA: Insufficient documentation

## 2014-01-13 DIAGNOSIS — Z01810 Encounter for preprocedural cardiovascular examination: Secondary | ICD-10-CM | POA: Insufficient documentation

## 2014-01-13 DIAGNOSIS — Z01812 Encounter for preprocedural laboratory examination: Secondary | ICD-10-CM | POA: Insufficient documentation

## 2014-01-13 DIAGNOSIS — Z01818 Encounter for other preprocedural examination: Secondary | ICD-10-CM | POA: Insufficient documentation

## 2014-01-13 LAB — BASIC METABOLIC PANEL
BUN: 13 mg/dL (ref 6–23)
CO2: 30 mEq/L (ref 19–32)
Calcium: 9.6 mg/dL (ref 8.4–10.5)
Chloride: 100 mEq/L (ref 96–112)
Creatinine, Ser: 0.83 mg/dL (ref 0.50–1.35)
GFR calc Af Amer: >90 mL/min (ref >90)
GFR calc non Af Amer: 90 mL/min — ABNORMAL LOW (ref >90)
Glucose, Bld: 98 mg/dL (ref 70–99)
Potassium: 4.2 mEq/L (ref 3.7–5.3)
Sodium: 139 mEq/L (ref 137–147)

## 2014-01-13 LAB — URINALYSIS, ROUTINE W REFLEX MICROSCOPIC
Bilirubin Urine: NEGATIVE
Glucose, UA: NEGATIVE mg/dL
Ketones, ur: NEGATIVE mg/dL
Leukocytes, UA: NEGATIVE
Nitrite: NEGATIVE
Protein, ur: NEGATIVE mg/dL
Specific Gravity, Urine: 1.014 (ref 1.005–1.030)
Urobilinogen, UA: 0.2 mg/dL (ref 0.0–1.0)
pH: 6 (ref 5.0–8.0)

## 2014-01-13 LAB — SURGICAL PCR SCREEN
MRSA, PCR: NEGATIVE
Staphylococcus aureus: NEGATIVE

## 2014-01-13 LAB — CBC
HCT: 42 % (ref 39.0–52.0)
Hemoglobin: 14.9 g/dL (ref 13.0–17.0)
MCH: 31.4 pg (ref 26.0–34.0)
MCHC: 35.5 g/dL (ref 30.0–36.0)
MCV: 88.4 fL (ref 78.0–100.0)
Platelets: 240 10*3/uL (ref 150–400)
RBC: 4.75 MIL/uL (ref 4.22–5.81)
RDW: 12.9 % (ref 11.5–15.5)
WBC: 5.3 10*3/uL (ref 4.0–10.5)

## 2014-01-13 LAB — URINE MICROSCOPIC-ADD ON

## 2014-01-13 LAB — PROTIME-INR
INR: 0.97 (ref 0.00–1.49)
Prothrombin Time: 12.7 seconds (ref 11.6–15.2)

## 2014-01-13 LAB — APTT: aPTT: 33 seconds (ref 24–37)

## 2014-01-13 NOTE — Pre-Procedure Instructions (Signed)
NO ORDERS IN EPIC AT TIME OF PREOP VISIT - LABS, CXR, EKG WERE DONE PREOP AS PER ANESTHESIOLOGIST'S GUIDELINES.

## 2014-01-13 NOTE — Patient Instructions (Signed)
   YOUR SURGERY IS SCHEDULED AT [facility]  ON:  Monday  4/20  REPORT TO  SHORT STAY CENTER AT:  12:30 PM      PHONE # FOR SHORT STAY IS [phone]  DO NOT EAT  ANYTHING AFTER MIDNIGHT THE NIGHT BEFORE YOUR SURGERY.  YOU MAY BRUSH YOUR TEETH.   NO FOOD, NO CHEWING GUM, NO MINTS, NO CANDIES, NO CHEWING TOBACCO. YOU MAY HAVE CLEAR LIQUIDS TO DRINK FROM MIDNIGHT UNTIL 9:00 AM DAY OF YOUR SURGERY - LIKE WATER,  COFFEE ( NO MILK OR MILK PRODUCTS ), TEA, APPLE JUICE.       NOTHING TO DRINK AFTER 9:00 AM DAY OF SURGERY.  PLEASE TAKE THE FOLLOWING MEDICATIONS THE AM OF YOUR SURGERY WITH A FEW SIPS OF WATER:  GABAPENTIN, LEVOTHYROXINE.   USE AND BRING YOUR ALBUTEROL INHALER.   IF YOU HAVE SLEEP APNEA AND USE CPAP OR BIPAP--PLEASE BRING THE MASK AND THE TUBING.  DO NOT BRING YOUR MACHINE.  DO NOT BRING VALUABLES, MONEY, CREDIT CARDS.  DO NOT WEAR JEWELRY, MAKE-UP, NAIL POLISH AND NO METAL PINS OR CLIPS IN YOUR HAIR. CONTACT LENS, DENTURES / PARTIALS, GLASSES SHOULD NOT BE WORN TO SURGERY AND IN MOST CASES-HEARING AIDS WILL NEED TO BE REMOVED.  BRING YOUR GLASSES CASE, ANY EQUIPMENT NEEDED FOR YOUR CONTACT LENS. FOR PATIENTS ADMITTED TO THE HOSPITAL--CHECK OUT TIME THE DAY OF DISCHARGE IS 11:00 AM.  ALL INPATIENT ROOMS ARE PRIVATE - WITH BATHROOM, TELEPHONE, TELEVISION AND WIFI INTERNET.                                                   FAILURE TO FOLLOW THESE INSTRUCTIONS MAY RESULT IN THE CANCELLATION OF YOUR SURGERY. PLEASE BE AWARE THAT YOU MAY NEED ADDITIONAL BLOOD DRAWN DAY OF YOUR SURGERY  PATIENT SIGNATURE_________________________________

## 2014-01-16 NOTE — H&P (Signed)
Jerry Mathews is an 68 y.o. male.    Procedure: Left knee open reduction internal fixation vs resection  Chief Complaint:     Left patella fracture s/p TKA   HPI: Pt is a 68 y.o. male complaining of left knee pain for 6 weeks. Pain has been continual since the beginning. X-rays in the clinic show a previous total knee arthroplasty with a fractured thin patella. Dr. Alvan Dame has previously revised the patella component do to the poly piece loosening on January 06, 2013.  His previous surgeries on the left knee include the index TKA in 2008 by Dr. Hal Morales and a subsequent revision of the knee in 2012.  Pt has tried various conservative treatments which have failed to alleviate their symptoms, including assistance, activity modification, NSAIDs and/ analgesic medications. Various options are discussed with the patient and he wishes to proceed with the above precedure. Risks, benefits and expectations were discussed with the patient.  Risks including but not limited to the risk of anesthesia, blood clots, nerve damage, blood vessel damage, failure of the prosthesis, infection and up to and including death.  Patient understand the risks, benefits and expectations and wishes to proceed with surgery.   PCP:  Cathlean Cower, MD  D/C Plans:   Home with HHPT  Post-op Meds:   No Rx given  Tranexamic Acid:   Not to be given - Prostate CA  Decadron:    To be given  FYI:    ASA post-op  Norco post-op  CPAP   PMH: Past Medical History  Diagnosis Date  . Diverticulosis   . Anxiety   . Asthma   . Hypertension   . Arthritis   . Hypothyroidism   . Neuropathy of both feet   . OSA on CPAP   . History of colon polyps     ADENOMATOUS  . OA (osteoarthritis) of knee     RIGHT  . Right knee meniscal tear   . Prostate cancer     [facility] [place]   . Depression     PTSD-- MEDICATIONS HELP- AND HIS DOG HELPS; [place]. HAS SOME MEMORY  PROBLEMS WHICH PT AND HIS WIFE THINK MAY BE RELATED TO PTSD    PSH: Past Surgical History  Procedure Laterality Date  . Total knee arthroplasty Left 11-12-2006    AND 02-05-2007  CLOSED MANIPULATION LEFT KNEE  . Knee arthroscopy Left 06-03-2004  &  07-12-2006    DEBRIDEMENT OF MENISCUS, PARTIAL ACL TEAR AND CHONDROMALACIA  . Mandible reconstruction  1976  . Hand surgery Left 2009  . Total knee revision Left 01/06/2013    Procedure: PATELLA COMPONENT REVISION / removal and replacement of patella component ;  Surgeon: Mauri Pole, MD;  Location: [facility];  Service: Orthopedics;  Laterality: Left;  pt. also has a femoral nerve block in left leg.  . Revision patella of left total knee  05-01-2011  . Knee arthroscopy with medial menisectomy Right 08/21/2013    Procedure: KNEE ARTHROSCOPY WITH MEDIAL MENISECTOMY;  Surgeon: Mauri Pole, MD;  Location: [facility];  Service: Orthopedics;  Laterality: Right;  . Knee arthroscopy with lateral menisectomy Right 08/21/2013    Procedure: KNEE ARTHROSCOPY WITH LATERAL MENISECTOMY;  Surgeon: Mauri Pole, MD;  Location: [facility];  Service: Orthopedics;  Laterality: Right;  . Chondroplasty Right 08/21/2013    Procedure:  MEDIAL PETELLAR CHONDROPLASTY;  Surgeon: Mauri Pole, MD;  Location: [place];  Service: Orthopedics;  Laterality: Right;    Social History:  reports that he quit smoking about 20 years ago. His smoking use included Cigarettes. He has a 25 pack-year smoking history. He has never used smokeless tobacco. He reports that he drinks alcohol. He reports that he does not use illicit drugs.  Allergies:  No Known Allergies  Medications: No current facility-administered medications for this encounter.   Current Outpatient Prescriptions  Medication Sig Dispense Refill  . albuterol (PROVENTIL HFA;VENTOLIN HFA) 108 (90 BASE) MCG/ACT inhaler Inhale 1-2 puffs into the lungs every 6 (six)  hours as needed for wheezing or shortness of breath.       Mariane Baumgarten Sodium (DSS) 100 MG CAPS Take 100 mg by mouth 2 (two) times daily.      Marland Kitchen gabapentin (NEURONTIN) 300 MG capsule Take 600-900 mg by mouth 3 (three) times daily. 600MG   AM/ 600 mg lunch   900MG  HS      . levothyroxine (SYNTHROID, LEVOTHROID) 88 MCG tablet Take 88 mcg by mouth daily before breakfast.       . lisinopril (PRINIVIL,ZESTRIL) 10 MG tablet Take 10 mg by mouth daily before breakfast.       . nortriptyline (PAMELOR) 50 MG capsule Take 50 mg by mouth at bedtime.      . traZODone (DESYREL) 150 MG tablet Take by mouth at bedtime.        Review of Systems  Constitutional: Negative.   HENT: Negative.   Eyes: Negative.   Respiratory: Negative.   Cardiovascular: Negative.   Gastrointestinal: Negative.   Genitourinary: Negative.   Musculoskeletal: Positive for joint pain.  Skin: Negative.   Neurological: Negative.   Endo/Heme/Allergies: Negative.   Psychiatric/Behavioral: Positive for depression. The patient is nervous/anxious.      Physical Exam  Constitutional: He is oriented to person, place, and time. He appears well-developed and well-nourished.  HENT:  Head: Normocephalic and atraumatic.  Mouth/Throat: Oropharynx is clear and moist.  Eyes: Pupils are equal, round, and reactive to light.  Neck: Neck supple. No JVD present. No tracheal deviation present. No thyromegaly present.  Cardiovascular: Normal rate, regular rhythm, normal heart sounds and intact distal pulses.   Respiratory: Effort normal and breath sounds normal. No stridor. No respiratory distress. He has no wheezes.  GI: Soft. There is no tenderness. There is no guarding.  Musculoskeletal:       Left knee: He exhibits swelling, laceration (healed) and bony tenderness. He exhibits no ecchymosis, no deformity and no erythema. Tenderness found.  Lymphadenopathy:    He has no cervical adenopathy.  Neurological: He is alert and oriented to person,  place, and time.  Skin: Skin is warm and dry.  Psychiatric: He has a normal mood and affect.     Assessment/Plan Assessment:     Left patella fracture s/p TKA   Plan: Patient will undergo a left knee open reduction internal fixation vs resection on 01/19/2014 per Dr. Alvan Dame at [facility]. Risks benefits and expectations were discussed with the patient. Patient understand risks, benefits and expectations and wishes to proceed.   [place]   PAC  01/16/2014, 3:46 PM

## 2014-01-18 MED ORDER — CEFAZOLIN SODIUM 10 G IJ SOLR
3.0000 g | INTRAMUSCULAR | Status: AC
  Administered 2014-01-19: 3 g via INTRAVENOUS
  Filled 2014-01-18: qty 3000

## 2014-01-19 ENCOUNTER — Encounter (HOSPITAL_COMMUNITY): Payer: Self-pay | Admitting: *Deleted

## 2014-01-19 ENCOUNTER — Encounter (HOSPITAL_COMMUNITY): Payer: Non-veteran care | Admitting: Certified Registered Nurse Anesthetist

## 2014-01-19 ENCOUNTER — Inpatient Hospital Stay (HOSPITAL_COMMUNITY): Payer: Non-veteran care | Admitting: Certified Registered Nurse Anesthetist

## 2014-01-19 ENCOUNTER — Encounter (HOSPITAL_COMMUNITY)
Admission: RE | Disposition: A | Discharge status: Home Health | Payer: Self-pay | Source: Ambulatory Visit | Attending: Orthopedic Surgery

## 2014-01-19 ENCOUNTER — Inpatient Hospital Stay (HOSPITAL_COMMUNITY)
Admission: RE | Admit: 2014-01-19 | Discharge: 2014-01-20 | DRG: 488 | Disposition: A | Discharge status: Home Health | Payer: Non-veteran care | Source: Ambulatory Visit | Attending: Orthopedic Surgery | Admitting: Orthopedic Surgery

## 2014-01-19 DIAGNOSIS — Z6832 Body mass index (BMI) 32.0-32.9, adult: Secondary | ICD-10-CM

## 2014-01-19 DIAGNOSIS — E039 Hypothyroidism, unspecified: Secondary | ICD-10-CM | POA: Diagnosis present

## 2014-01-19 DIAGNOSIS — Y831 Surgical operation with implant of artificial internal device as the cause of abnormal reaction of the patient, or of later complication, without mention of misadventure at the time of the procedure: Secondary | ICD-10-CM | POA: Diagnosis present

## 2014-01-19 DIAGNOSIS — M1710 Unilateral primary osteoarthritis, unspecified knee: Secondary | ICD-10-CM | POA: Diagnosis present

## 2014-01-19 DIAGNOSIS — S82002A Unspecified fracture of left patella, initial encounter for closed fracture: Secondary | ICD-10-CM | POA: Diagnosis present

## 2014-01-19 DIAGNOSIS — G4733 Obstructive sleep apnea (adult) (pediatric): Secondary | ICD-10-CM | POA: Diagnosis present

## 2014-01-19 DIAGNOSIS — IMO0002 Reserved for concepts with insufficient information to code with codable children: Secondary | ICD-10-CM | POA: Diagnosis present

## 2014-01-19 DIAGNOSIS — F3289 Other specified depressive episodes: Secondary | ICD-10-CM | POA: Diagnosis present

## 2014-01-19 DIAGNOSIS — M25569 Pain in unspecified knee: Secondary | ICD-10-CM | POA: Diagnosis present

## 2014-01-19 DIAGNOSIS — J45909 Unspecified asthma, uncomplicated: Secondary | ICD-10-CM | POA: Diagnosis present

## 2014-01-19 DIAGNOSIS — E669 Obesity, unspecified: Secondary | ICD-10-CM | POA: Diagnosis present

## 2014-01-19 DIAGNOSIS — T84039A Mechanical loosening of unspecified internal prosthetic joint, initial encounter: Secondary | ICD-10-CM | POA: Diagnosis present

## 2014-01-19 DIAGNOSIS — I10 Essential (primary) hypertension: Secondary | ICD-10-CM | POA: Diagnosis present

## 2014-01-19 DIAGNOSIS — S82009A Unspecified fracture of unspecified patella, initial encounter for closed fracture: Principal | ICD-10-CM | POA: Diagnosis present

## 2014-01-19 DIAGNOSIS — F329 Major depressive disorder, single episode, unspecified: Secondary | ICD-10-CM | POA: Diagnosis present

## 2014-01-19 DIAGNOSIS — Z87891 Personal history of nicotine dependence: Secondary | ICD-10-CM

## 2014-01-19 DIAGNOSIS — Z8546 Personal history of malignant neoplasm of prostate: Secondary | ICD-10-CM

## 2014-01-19 DIAGNOSIS — Z96659 Presence of unspecified artificial knee joint: Secondary | ICD-10-CM

## 2014-01-19 DIAGNOSIS — Z79899 Other long term (current) drug therapy: Secondary | ICD-10-CM

## 2014-01-19 DIAGNOSIS — G5790 Unspecified mononeuropathy of unspecified lower limb: Secondary | ICD-10-CM | POA: Diagnosis present

## 2014-01-19 DIAGNOSIS — F411 Generalized anxiety disorder: Secondary | ICD-10-CM | POA: Diagnosis present

## 2014-01-19 HISTORY — PX: ORIF PATELLA: SHX5033

## 2014-01-19 LAB — TYPE AND SCREEN
ABO/RH(D): AB POS
Antibody Screen: NEGATIVE

## 2014-01-19 SURGERY — OPEN REDUCTION INTERNAL FIXATION (ORIF) PATELLA
Anesthesia: General | Site: Knee | Laterality: Left

## 2014-01-19 MED ORDER — FENTANYL CITRATE 0.05 MG/ML IJ SOLN
INTRAMUSCULAR | Status: AC
  Filled 2014-01-19: qty 2

## 2014-01-19 MED ORDER — METOCLOPRAMIDE HCL 10 MG PO TABS: 5.0000 mg | ORAL_TABLET | Freq: Three times a day (TID) | ORAL | Status: DC | PRN

## 2014-01-19 MED ORDER — ACETAMINOPHEN 10 MG/ML IV SOLN
INTRAVENOUS | Status: DC | PRN
  Administered 2014-01-19: 1000 mg via INTRAVENOUS

## 2014-01-19 MED ORDER — ONDANSETRON HCL 4 MG/2ML IJ SOLN: 4.0000 mg | Freq: Four times a day (QID) | INTRAMUSCULAR | Status: DC | PRN

## 2014-01-19 MED ORDER — PHENOL 1.4 % MT LIQD: 1.0000 | OROMUCOSAL | Status: DC | PRN

## 2014-01-19 MED ORDER — ONDANSETRON HCL 4 MG/2ML IJ SOLN
INTRAMUSCULAR | Status: AC
  Filled 2014-01-19: qty 2

## 2014-01-19 MED ORDER — ONDANSETRON HCL 4 MG PO TABS: 4.0000 mg | ORAL_TABLET | Freq: Four times a day (QID) | ORAL | Status: DC | PRN

## 2014-01-19 MED ORDER — ASPIRIN EC 325 MG PO TBEC
325.0000 mg | DELAYED_RELEASE_TABLET | Freq: Two times a day (BID) | ORAL | Status: DC
  Administered 2014-01-20: 325 mg via ORAL
  Filled 2014-01-19 (×3): qty 1

## 2014-01-19 MED ORDER — ONDANSETRON HCL 4 MG/2ML IJ SOLN
INTRAMUSCULAR | Status: DC | PRN
  Administered 2014-01-19: 4 mg via INTRAVENOUS

## 2014-01-19 MED ORDER — LIDOCAINE HCL (CARDIAC) 20 MG/ML IV SOLN
INTRAVENOUS | Status: DC | PRN
  Administered 2014-01-19: 75 mg via INTRAVENOUS

## 2014-01-19 MED ORDER — EPHEDRINE SULFATE 50 MG/ML IJ SOLN
INTRAMUSCULAR | Status: DC | PRN
  Administered 2014-01-19: 10 mg via INTRAVENOUS
  Administered 2014-01-19 (×3): 5 mg via INTRAVENOUS

## 2014-01-19 MED ORDER — DEXAMETHASONE SODIUM PHOSPHATE 10 MG/ML IJ SOLN
INTRAMUSCULAR | Status: AC
  Filled 2014-01-19: qty 1

## 2014-01-19 MED ORDER — MIDAZOLAM HCL 2 MG/2ML IJ SOLN
INTRAMUSCULAR | Status: AC
  Filled 2014-01-19: qty 2

## 2014-01-19 MED ORDER — MENTHOL 3 MG MT LOZG: 1.0000 | LOZENGE | OROMUCOSAL | Status: DC | PRN

## 2014-01-19 MED ORDER — DEXTROSE 5 % IV SOLN
500.0000 mg | Freq: Four times a day (QID) | INTRAVENOUS | Status: DC | PRN
  Filled 2014-01-19: qty 5

## 2014-01-19 MED ORDER — POLYETHYLENE GLYCOL 3350 17 G PO PACK
17.0000 g | PACK | Freq: Two times a day (BID) | ORAL | Status: DC
  Administered 2014-01-19 – 2014-01-20 (×2): 17 g via ORAL

## 2014-01-19 MED ORDER — FENTANYL CITRATE 0.05 MG/ML IJ SOLN
INTRAMUSCULAR | Status: AC
  Filled 2014-01-19: qty 5

## 2014-01-19 MED ORDER — BUPIVACAINE-EPINEPHRINE 0.25% -1:200000 IJ SOLN
INTRAMUSCULAR | Status: DC | PRN
  Administered 2014-01-19: 25 mL

## 2014-01-19 MED ORDER — CISATRACURIUM BESYLATE 20 MG/10ML IV SOLN
INTRAVENOUS | Status: AC
  Filled 2014-01-19: qty 10

## 2014-01-19 MED ORDER — CEFAZOLIN SODIUM-DEXTROSE 2-3 GM-% IV SOLR
2.0000 g | Freq: Four times a day (QID) | INTRAVENOUS | Status: AC
  Administered 2014-01-20 (×2): 2 g via INTRAVENOUS
  Filled 2014-01-19 (×2): qty 50

## 2014-01-19 MED ORDER — POTASSIUM CHLORIDE 2 MEQ/ML IV SOLN
INTRAVENOUS | Status: DC
  Administered 2014-01-19: 22:00:00 via INTRAVENOUS
  Filled 2014-01-19 (×5): qty 1000

## 2014-01-19 MED ORDER — DEXAMETHASONE SODIUM PHOSPHATE 10 MG/ML IJ SOLN
INTRAMUSCULAR | Status: DC | PRN
  Administered 2014-01-19: 10 mg via INTRAVENOUS

## 2014-01-19 MED ORDER — DEXAMETHASONE SODIUM PHOSPHATE 10 MG/ML IJ SOLN: 10.0000 mg | Freq: Once | INTRAMUSCULAR | Status: DC

## 2014-01-19 MED ORDER — 0.9 % SODIUM CHLORIDE (POUR BTL) OPTIME
TOPICAL | Status: DC | PRN
  Administered 2014-01-19: 1000 mL

## 2014-01-19 MED ORDER — SODIUM CHLORIDE 0.9 % IJ SOLN
INTRAMUSCULAR | Status: AC
  Filled 2014-01-19: qty 50

## 2014-01-19 MED ORDER — METOCLOPRAMIDE HCL 5 MG/ML IJ SOLN: 5.0000 mg | Freq: Three times a day (TID) | INTRAMUSCULAR | Status: DC | PRN

## 2014-01-19 MED ORDER — NEOSTIGMINE METHYLSULFATE 1 MG/ML IJ SOLN
INTRAMUSCULAR | Status: AC
Start: 2014-01-19 — End: 2014-01-19
  Filled 2014-01-19: qty 10

## 2014-01-19 MED ORDER — NORTRIPTYLINE HCL 25 MG PO CAPS
50.0000 mg | ORAL_CAPSULE | Freq: Every day | ORAL | Status: DC
  Administered 2014-01-19: 50 mg via ORAL
  Filled 2014-01-19 (×2): qty 2

## 2014-01-19 MED ORDER — KETOROLAC TROMETHAMINE 30 MG/ML IJ SOLN
INTRAMUSCULAR | Status: AC
  Filled 2014-01-19: qty 1

## 2014-01-19 MED ORDER — MAGNESIUM CITRATE PO SOLN: 1.0000 | Freq: Once | ORAL | Status: AC | PRN

## 2014-01-19 MED ORDER — BUPIVACAINE-EPINEPHRINE (PF) 0.25% -1:200000 IJ SOLN
INTRAMUSCULAR | Status: AC
Start: 2014-01-19 — End: 2014-01-19
  Filled 2014-01-19: qty 30

## 2014-01-19 MED ORDER — DOCUSATE SODIUM 100 MG PO CAPS
100.0000 mg | ORAL_CAPSULE | Freq: Two times a day (BID) | ORAL | Status: DC
  Administered 2014-01-19 – 2014-01-20 (×2): 100 mg via ORAL

## 2014-01-19 MED ORDER — BISACODYL 10 MG RE SUPP: 10.0000 mg | Freq: Every day | RECTAL | Status: DC | PRN

## 2014-01-19 MED ORDER — NEOSTIGMINE METHYLSULFATE 1 MG/ML IJ SOLN
INTRAMUSCULAR | Status: DC | PRN
  Administered 2014-01-19: 3 mg via INTRAVENOUS

## 2014-01-19 MED ORDER — ROPIVACAINE HCL 5 MG/ML IJ SOLN
INTRAMUSCULAR | Status: AC
  Filled 2014-01-19: qty 30

## 2014-01-19 MED ORDER — HYDROCODONE-ACETAMINOPHEN 7.5-325 MG PO TABS
1.0000 | ORAL_TABLET | ORAL | Status: DC
  Administered 2014-01-19: 2 via ORAL
  Administered 2014-01-20: 1 via ORAL
  Administered 2014-01-20 (×3): 2 via ORAL
  Filled 2014-01-19 (×5): qty 2

## 2014-01-19 MED ORDER — BUPIVACAINE LIPOSOME 1.3 % IJ SUSP
20.0000 mL | Freq: Once | INTRAMUSCULAR | Status: AC
  Administered 2014-01-19: 20 mL
  Filled 2014-01-19: qty 20

## 2014-01-19 MED ORDER — EPHEDRINE SULFATE 50 MG/ML IJ SOLN
INTRAMUSCULAR | Status: AC
  Filled 2014-01-19: qty 1

## 2014-01-19 MED ORDER — ALUM & MAG HYDROXIDE-SIMETH 200-200-20 MG/5ML PO SUSP: 30.0000 mL | ORAL | Status: DC | PRN

## 2014-01-19 MED ORDER — MIDAZOLAM HCL 5 MG/5ML IJ SOLN
INTRAMUSCULAR | Status: DC | PRN
  Administered 2014-01-19 (×2): 1 mg via INTRAVENOUS

## 2014-01-19 MED ORDER — SUCCINYLCHOLINE CHLORIDE 20 MG/ML IJ SOLN
INTRAMUSCULAR | Status: DC | PRN
  Administered 2014-01-19: 140 mg via INTRAVENOUS

## 2014-01-19 MED ORDER — DIPHENHYDRAMINE HCL 25 MG PO CAPS: 25.0000 mg | ORAL_CAPSULE | Freq: Four times a day (QID) | ORAL | Status: DC | PRN

## 2014-01-19 MED ORDER — FENTANYL CITRATE 0.05 MG/ML IJ SOLN
INTRAMUSCULAR | Status: DC | PRN
  Administered 2014-01-19: 100 ug via INTRAVENOUS
  Administered 2014-01-19 (×3): 50 ug via INTRAVENOUS

## 2014-01-19 MED ORDER — GABAPENTIN 300 MG PO CAPS
600.0000 mg | ORAL_CAPSULE | ORAL | Status: DC
  Administered 2014-01-20 (×2): 600 mg via ORAL
  Filled 2014-01-19 (×3): qty 2

## 2014-01-19 MED ORDER — HYDROMORPHONE HCL PF 1 MG/ML IJ SOLN: 0.2500 mg | INTRAMUSCULAR | Status: DC | PRN

## 2014-01-19 MED ORDER — METHOCARBAMOL 500 MG PO TABS: 500.0000 mg | ORAL_TABLET | Freq: Four times a day (QID) | ORAL | Status: DC | PRN

## 2014-01-19 MED ORDER — KETOROLAC TROMETHAMINE 30 MG/ML IJ SOLN
INTRAMUSCULAR | Status: DC | PRN
  Administered 2014-01-19: 30 mg

## 2014-01-19 MED ORDER — BUPIVACAINE-EPINEPHRINE (PF) 0.25% -1:200000 IJ SOLN
INTRAMUSCULAR | Status: AC
  Filled 2014-01-19: qty 30

## 2014-01-19 MED ORDER — FERROUS SULFATE 325 (65 FE) MG PO TABS
325.0000 mg | ORAL_TABLET | Freq: Three times a day (TID) | ORAL | Status: DC
  Administered 2014-01-19 – 2014-01-20 (×3): 325 mg via ORAL
  Filled 2014-01-19 (×5): qty 1

## 2014-01-19 MED ORDER — PROPOFOL 10 MG/ML IV BOLUS
INTRAVENOUS | Status: AC
  Filled 2014-01-19: qty 20

## 2014-01-19 MED ORDER — DEXAMETHASONE SODIUM PHOSPHATE 10 MG/ML IJ SOLN
10.0000 mg | Freq: Once | INTRAMUSCULAR | Status: AC
  Administered 2014-01-20: 10 mg via INTRAVENOUS
  Filled 2014-01-19: qty 1

## 2014-01-19 MED ORDER — CHLORHEXIDINE GLUCONATE 4 % EX LIQD: 60.0000 mL | Freq: Once | CUTANEOUS | Status: DC

## 2014-01-19 MED ORDER — PROPOFOL 10 MG/ML IV BOLUS
INTRAVENOUS | Status: DC | PRN
  Administered 2014-01-19: 200 mg via INTRAVENOUS

## 2014-01-19 MED ORDER — GABAPENTIN 300 MG PO CAPS
900.0000 mg | ORAL_CAPSULE | Freq: Every day | ORAL | Status: DC
  Administered 2014-01-19: 900 mg via ORAL
  Filled 2014-01-19 (×2): qty 3

## 2014-01-19 MED ORDER — SODIUM CHLORIDE 0.9 % IJ SOLN
INTRAMUSCULAR | Status: DC | PRN
  Administered 2014-01-19: 14 mL

## 2014-01-19 MED ORDER — CELECOXIB 200 MG PO CAPS
200.0000 mg | ORAL_CAPSULE | Freq: Two times a day (BID) | ORAL | Status: DC
  Administered 2014-01-19 – 2014-01-20 (×2): 200 mg via ORAL
  Filled 2014-01-19 (×3): qty 1

## 2014-01-19 MED ORDER — ATROPINE SULFATE 0.4 MG/ML IJ SOLN
INTRAMUSCULAR | Status: AC
  Filled 2014-01-19: qty 1

## 2014-01-19 MED ORDER — ALBUTEROL SULFATE (2.5 MG/3ML) 0.083% IN NEBU: 3.0000 mL | INHALATION_SOLUTION | Freq: Four times a day (QID) | RESPIRATORY_TRACT | Status: DC | PRN

## 2014-01-19 MED ORDER — HYDROMORPHONE HCL PF 1 MG/ML IJ SOLN: 0.5000 mg | INTRAMUSCULAR | Status: DC | PRN

## 2014-01-19 MED ORDER — TRAZODONE HCL 150 MG PO TABS
150.0000 mg | ORAL_TABLET | Freq: Every day | ORAL | Status: DC
  Administered 2014-01-19: 150 mg via ORAL
  Filled 2014-01-19 (×2): qty 1

## 2014-01-19 MED ORDER — LACTATED RINGERS IV SOLN
INTRAVENOUS | Status: DC
  Administered 2014-01-19: 18:00:00 via INTRAVENOUS
  Administered 2014-01-19: 1000 mL via INTRAVENOUS
  Administered 2014-01-19: 17:00:00 via INTRAVENOUS

## 2014-01-19 MED ORDER — GLYCOPYRROLATE 0.2 MG/ML IJ SOLN
INTRAMUSCULAR | Status: AC
  Filled 2014-01-19: qty 4

## 2014-01-19 MED ORDER — GLYCOPYRROLATE 0.2 MG/ML IJ SOLN
INTRAMUSCULAR | Status: DC | PRN
  Administered 2014-01-19: .8 mg via INTRAVENOUS

## 2014-01-19 MED ORDER — LEVOTHYROXINE SODIUM 88 MCG PO TABS
88.0000 ug | ORAL_TABLET | Freq: Every day | ORAL | Status: DC
  Administered 2014-01-20: 88 ug via ORAL
  Filled 2014-01-19 (×2): qty 1

## 2014-01-19 MED ORDER — CISATRACURIUM BESYLATE (PF) 10 MG/5ML IV SOLN
INTRAVENOUS | Status: DC | PRN
  Administered 2014-01-19: 8 mg via INTRAVENOUS

## 2014-01-19 MED ORDER — SODIUM CHLORIDE 0.9 % IJ SOLN
INTRAMUSCULAR | Status: AC
  Filled 2014-01-19: qty 10

## 2014-01-19 MED ORDER — SODIUM CHLORIDE 0.9 % IR SOLN
Status: DC | PRN
  Administered 2014-01-19: 3000 mL

## 2014-01-19 MED ORDER — PROMETHAZINE HCL 25 MG/ML IJ SOLN
6.2500 mg | INTRAMUSCULAR | Status: DC | PRN
Start: 2014-01-19 — End: 2014-01-19

## 2014-01-19 MED ORDER — ROPIVACAINE HCL 5 MG/ML IJ SOLN
INTRAMUSCULAR | Status: DC | PRN
  Administered 2014-01-19: 30 mL via PERINEURAL

## 2014-01-19 SURGICAL SUPPLY — 60 items
BAG SPEC THK2 15X12 ZIP CLS (MISCELLANEOUS)
BAG ZIPLOCK 12X15 (MISCELLANEOUS) IMPLANT
BANDAGE ELASTIC 6 VELCRO ST LF (GAUZE/BANDAGES/DRESSINGS) ×3 IMPLANT
BANDAGE ESMARK 6X9 LF (GAUZE/BANDAGES/DRESSINGS) ×1 IMPLANT
BNDG COHESIVE 6X5 TAN STRL LF (GAUZE/BANDAGES/DRESSINGS) ×3 IMPLANT
BNDG ESMARK 6X9 LF (GAUZE/BANDAGES/DRESSINGS) ×3
BNDG GAUZE ELAST 4 BULKY (GAUZE/BANDAGES/DRESSINGS) IMPLANT
CUFF TOURN SGL QUICK 34 (TOURNIQUET CUFF) ×3
CUFF TOURN SGL QUICK 44 (TOURNIQUET CUFF) IMPLANT
CUFF TRNQT CYL 34X4X40X1 (TOURNIQUET CUFF) ×1 IMPLANT
DECANTER SPIKE VIAL GLASS SM (MISCELLANEOUS) ×3 IMPLANT
DERMABOND ADVANCED (GAUZE/BANDAGES/DRESSINGS) ×2
DERMABOND ADVANCED .7 DNX12 (GAUZE/BANDAGES/DRESSINGS) ×1 IMPLANT
DRAPE INCISE IOBAN 66X45 STRL (DRAPES) ×3 IMPLANT
DRAPE U-SHAPE 47X51 STRL (DRAPES) ×3 IMPLANT
DRSG AQUACEL AG ADV 3.5X10 (GAUZE/BANDAGES/DRESSINGS) ×3 IMPLANT
DRSG AQUACEL AG ADV 3.5X14 (GAUZE/BANDAGES/DRESSINGS) ×3 IMPLANT
DRSG EMULSION OIL 3X16 NADH (GAUZE/BANDAGES/DRESSINGS) IMPLANT
DRSG PAD ABDOMINAL 8X10 ST (GAUZE/BANDAGES/DRESSINGS) ×6 IMPLANT
DURAPREP 26ML APPLICATOR (WOUND CARE) ×3 IMPLANT
ELECT REM PT RETURN 9FT ADLT (ELECTROSURGICAL) ×3
ELECTRODE REM PT RTRN 9FT ADLT (ELECTROSURGICAL) ×1 IMPLANT
GLOVE BIOGEL PI IND STRL 7.5 (GLOVE) ×2 IMPLANT
GLOVE BIOGEL PI IND STRL 8 (GLOVE) ×1 IMPLANT
GLOVE BIOGEL PI INDICATOR 7.5 (GLOVE) ×4
GLOVE BIOGEL PI INDICATOR 8 (GLOVE) ×2
GLOVE ECLIPSE 8.0 STRL XLNG CF (GLOVE) ×3 IMPLANT
GLOVE ORTHO TXT STRL SZ7.5 (GLOVE) ×6 IMPLANT
GLOVE SURG ORTHO 8.0 STRL STRW (GLOVE) IMPLANT
GLOVE SURG SS PI 7.5 STRL IVOR (GLOVE) ×6 IMPLANT
GOWN SPEC L3 XXLG W/TWL (GOWN DISPOSABLE) ×6 IMPLANT
GOWN STRL REUS W/TWL LRG LVL3 (GOWN DISPOSABLE) ×3 IMPLANT
HANDPIECE INTERPULSE COAX TIP (DISPOSABLE) ×3
IMMOBILIZER KNEE 20 (SOFTGOODS) ×3 IMPLANT
IV NS IRRIG 3000ML ARTHROMATIC (IV SOLUTION) ×3 IMPLANT
MANIFOLD NEPTUNE II (INSTRUMENTS) ×3 IMPLANT
NS IRRIG 1000ML POUR BTL (IV SOLUTION) ×3 IMPLANT
PACK LOWER EXTREMITY WL (CUSTOM PROCEDURE TRAY) ×3 IMPLANT
PAD CAST 4YDX4 CTTN HI CHSV (CAST SUPPLIES) IMPLANT
PADDING CAST COTTON 4X4 STRL (CAST SUPPLIES)
PASSER SUT SWANSON 36MM LOOP (INSTRUMENTS) IMPLANT
POSITIONER SURGICAL ARM (MISCELLANEOUS) ×3 IMPLANT
SET HNDPC FAN SPRY TIP SCT (DISPOSABLE) ×1 IMPLANT
SPONGE GAUZE 4X4 12PLY (GAUZE/BANDAGES/DRESSINGS) IMPLANT
SPONGE LAP 18X18 X RAY DECT (DISPOSABLE) IMPLANT
SPONGE LAP 4X18 X RAY DECT (DISPOSABLE) IMPLANT
STAPLER VISISTAT (STAPLE) IMPLANT
SUT ETHIBOND NAB CT1 #1 30IN (SUTURE) ×3 IMPLANT
SUT FIBERWIRE #2 38 T-5 BLUE (SUTURE)
SUT MNCRL AB 3-0 PS2 18 (SUTURE) ×3 IMPLANT
SUT VIC AB 0 CT1 27 (SUTURE) ×6
SUT VIC AB 0 CT1 27XBRD ANTBC (SUTURE) ×2 IMPLANT
SUT VIC AB 1 CT1 27 (SUTURE) ×2
SUT VIC AB 1 CT1 27XBRD ANTBC (SUTURE) ×1 IMPLANT
SUT VIC AB 1 CT1 36 (SUTURE) ×6 IMPLANT
SUT VIC AB 2-0 CT1 27 (SUTURE) ×3
SUT VIC AB 2-0 CT1 TAPERPNT 27 (SUTURE) ×1 IMPLANT
SUTURE FIBERWR #2 38 T-5 BLUE (SUTURE) IMPLANT
TOWEL OR 17X26 10 PK STRL BLUE (TOWEL DISPOSABLE) ×6 IMPLANT
WATER STERILE IRR 1500ML POUR (IV SOLUTION) IMPLANT

## 2014-01-19 NOTE — Anesthesia Preprocedure Evaluation (Signed)
Anesthesia Evaluation  Patient identified by MRN, date of birth, ID band Patient awake    Reviewed: Allergy & Precautions, H&P , NPO status , Patient's Chart, lab work & pertinent test results  Airway Mallampati: II TM Distance: >3 FB Neck ROM: Full    Dental no notable dental hx.    Pulmonary asthma , sleep apnea and Continuous Positive Airway Pressure Ventilation , COPD COPD inhaler, former smoker,  breath sounds clear to auscultation  Pulmonary exam normal       Cardiovascular Exercise Tolerance: Good hypertension, Pt. on medications Rhythm:Regular Rate:Normal     Neuro/Psych PSYCHIATRIC DISORDERS Anxiety Depression  Neuromuscular disease    GI/Hepatic negative GI ROS, Neg liver ROS,   Endo/Other  Hypothyroidism   Renal/GU negative Renal ROS  negative genitourinary   Musculoskeletal negative musculoskeletal ROS (+)   Abdominal   Peds negative pediatric ROS (+)  Hematology negative hematology ROS (+)   Anesthesia Other Findings   Reproductive/Obstetrics negative OB ROS                           Anesthesia Physical Anesthesia Plan  ASA: III  Anesthesia Plan: General   Post-op Pain Management:    Induction: Intravenous  Airway Management Planned: Oral ETT  Additional Equipment:   Intra-op Plan:   Post-operative Plan: Extubation in OR  Informed Consent: I have reviewed the patients History and Physical, chart, labs and discussed the procedure including the risks, benefits and alternatives for the proposed anesthesia with the patient or authorized representative who has indicated his/her understanding and acceptance.   Dental advisory given  Plan Discussed with: CRNA  Anesthesia Plan Comments: (Discussed general with femoral knee block versus spinal. He had a general with [facility] last year and prefers the same today.  Discussed risks of femoral nerve block including failure,  bleeding, infection, nerve damage.  Femoral nerve block does not usually prevent all pain. Specifically, it treats the anterior, but often not the posterior knee. Questions answered.  Patient consents to block.)        Anesthesia Quick Evaluation

## 2014-01-19 NOTE — Transfer of Care (Signed)
Immediate Anesthesia Transfer of Care Note  Patient: Jerry Mathews  Procedure(s) Performed: Procedure(s): LEFT KNEE PARTIAL PATTELECTOMY  (Left)  Patient Location: PACU  Anesthesia Type:General  Level of Consciousness: awake, alert , oriented and patient cooperative  Airway & Oxygen Therapy: Patient Spontanous Breathing and Patient connected to face mask oxygen  Post-op Assessment: Report given to PACU RN, Post -op Vital signs reviewed and stable and Patient moving all extremities  Post vital signs: Reviewed and stable  Complications: No apparent anesthesia complications

## 2014-01-19 NOTE — Brief Op Note (Signed)
01/19/2014  6:37 PM  PATIENT:  Fredrich Birks  68 y.o. male  PRE-OPERATIVE DIAGNOSIS:  LEFT PATELLA FRACTURE, failed left total knee, patella component  POST-OPERATIVE DIAGNOSIS:  FAILED LEFT KNEE PATELLA COMPONENT, patella fracture  PROCEDURE:  Procedure(s): LEFT KNEE PARTIAL PATTELECTOMY  (Left), removal of loose patellar component  SURGEON:  Surgeon(s) and Role:    * Mauri Pole, MD - Primary  PHYSICIAN ASSISTANT: Danae Orleans, PA-C  ANESTHESIA:   regional and general  EBL:  Total I/O In: 1800 [I.V.:1800] Out: 25 [Blood:25]  BLOOD ADMINISTERED:none  DRAINS: none   LOCAL MEDICATIONS USED:  Exparel, marcaine combination  SPECIMEN:  No Specimen  DISPOSITION OF SPECIMEN:  N/A  COUNTS:  YES  TOURNIQUET:  43min at 244mmHg  DICTATION: .Other Dictation: Dictation Number [id number]  PLAN OF CARE: Admit to inpatient   PATIENT DISPOSITION:  PACU - hemodynamically stable.   Delay start of Pharmacological VTE agent (>24hrs) due to surgical blood loss or risk of bleeding: no

## 2014-01-19 NOTE — Anesthesia Postprocedure Evaluation (Signed)
  Anesthesia Post-op Note  Patient: Jerry Mathews  Procedure(s) Performed: Procedure(s) (LRB): LEFT KNEE PARTIAL PATTELECTOMY  (Left)  Patient Location: PACU  Anesthesia Type: GA combined with regional for post-op pain  Level of Consciousness: awake and alert   Airway and Oxygen Therapy: Patient Spontanous Breathing  Post-op Pain: mild  Post-op Assessment: Post-op Vital signs reviewed, Patient's Cardiovascular Status Stable, Respiratory Function Stable, Patent Airway and No signs of Nausea or vomiting  Last Vitals:  Filed Vitals:   01/19/14 1939  BP: 144/72  Pulse: 64  Temp: 36.4 C  Resp: 16    Post-op Vital Signs: stable   Complications: No apparent anesthesia complications

## 2014-01-19 NOTE — Anesthesia Procedure Notes (Addendum)
Anesthesia Regional Block:  Femoral nerve block  Pre-Anesthetic Checklist: ,, timeout performed, Correct Patient, Correct Site, Correct Laterality, Correct Procedure, Correct Position, site marked, Risks and benefits discussed,  Surgical consent,  Pre-op evaluation,  At surgeon's request and post-op pain management  Laterality: Lower and Left  Prep: chloraprep       Needles:  Injection technique: Single-shot  Needle Type: Stimiplex      Needle Gauge: 21 and 21 G    Additional Needles:  Procedures: ultrasound guided (picture in chart) and nerve stimulator Femoral nerve block  Nerve Stimulator or Paresthesia:  Response: quadriceps, 0.6 mA,   Additional Responses:   Narrative:   Performed by: Personally  Anesthesiologist: Denenny  Additional Notes: No pain on injection. No increased resistance to injection. Motor intact immediately after block. Loss of quadriceps strength at 20 minutes.    Procedure Name: Intubation Date/Time: 01/19/2014 5:10 PM Performed by: Ofilia Neas Pre-anesthesia Checklist: Patient identified, Timeout performed, Emergency Drugs available, Suction available and Patient being monitored Patient Re-evaluated:Patient Re-evaluated prior to inductionOxygen Delivery Method: Circle system utilized Preoxygenation: Pre-oxygenation with 100% oxygen Intubation Type: IV induction Ventilation: Mask ventilation without difficulty Laryngoscope Size: Mac and 4 Tube type: Oral Tube size: 7.5 mm Number of attempts: 1 Airway Equipment and Method: Stylet Secured at: 21 cm Tube secured with: Tape Dental Injury: Teeth and Oropharynx as per pre-operative assessment  Difficulty Due To: Difficulty was anticipated, Difficult Airway- due to large tongue, Difficult Airway- due to reduced neck mobility, Difficult Airway- due to limited oral opening and Difficult Airway- due to anterior larynx Future Recommendations: Recommend- induction with short-acting agent, and  alternative techniques readily available

## 2014-01-20 ENCOUNTER — Encounter (HOSPITAL_COMMUNITY): Payer: Self-pay | Admitting: Orthopedic Surgery

## 2014-01-20 DIAGNOSIS — E669 Obesity, unspecified: Secondary | ICD-10-CM | POA: Diagnosis present

## 2014-01-20 LAB — CBC
HCT: 37.2 % — ABNORMAL LOW (ref 39.0–52.0)
Hemoglobin: 13.1 g/dL (ref 13.0–17.0)
MCH: 31.4 pg (ref 26.0–34.0)
MCHC: 35.2 g/dL (ref 30.0–36.0)
MCV: 89.2 fL (ref 78.0–100.0)
Platelets: 236 10*3/uL (ref 150–400)
RBC: 4.17 MIL/uL — ABNORMAL LOW (ref 4.22–5.81)
RDW: 13 % (ref 11.5–15.5)
WBC: 5.9 10*3/uL (ref 4.0–10.5)

## 2014-01-20 LAB — BASIC METABOLIC PANEL
BUN: 11 mg/dL (ref 6–23)
CO2: 27 mEq/L (ref 19–32)
Calcium: 8.8 mg/dL (ref 8.4–10.5)
Chloride: 103 mEq/L (ref 96–112)
Creatinine, Ser: 0.83 mg/dL (ref 0.50–1.35)
GFR calc Af Amer: >90 mL/min (ref >90)
GFR calc non Af Amer: 90 mL/min — ABNORMAL LOW (ref >90)
Glucose, Bld: 144 mg/dL — ABNORMAL HIGH (ref 70–99)
Potassium: 4.5 mEq/L (ref 3.7–5.3)
Sodium: 140 mEq/L (ref 137–147)

## 2014-01-20 MED ORDER — HYDROCODONE-ACETAMINOPHEN 7.5-325 MG PO TABS: 1.0000 | ORAL_TABLET | ORAL | Status: DC | PRN

## 2014-01-20 MED ORDER — DSS 100 MG PO CAPS: 100.0000 mg | ORAL_CAPSULE | Freq: Two times a day (BID) | ORAL | Status: DC

## 2014-01-20 MED ORDER — ASPIRIN 325 MG PO TBEC: 325.0000 mg | DELAYED_RELEASE_TABLET | Freq: Two times a day (BID) | ORAL | Status: AC

## 2014-01-20 MED ORDER — POLYETHYLENE GLYCOL 3350 17 G PO PACK: 17.0000 g | PACK | Freq: Two times a day (BID) | ORAL | Status: DC

## 2014-01-20 MED ORDER — FERROUS SULFATE 325 (65 FE) MG PO TABS: 325.0000 mg | ORAL_TABLET | Freq: Three times a day (TID) | ORAL | Status: DC

## 2014-01-20 MED ORDER — METHOCARBAMOL 500 MG PO TABS: 500.0000 mg | ORAL_TABLET | Freq: Four times a day (QID) | ORAL | Status: DC | PRN

## 2014-01-20 NOTE — Evaluation (Signed)
Physical Therapy Evaluation Patient Details Name: Jerry Mathews MRN: [id number] DOB: 08-12-1946 Today's Date: 01/20/2014   History of Present Illness  L patella fx, revision  Clinical Impression  L quads are  Nonfunctioning at present after Femoral nerve block. Pt cautioned to not get up without RW and KI. Per RN, MD stated to limit L knee flexion. Pt will benefit from PT to address problems listed to return to safe  Ambulation  To DC to day. Has to pass step training.    Follow Up Recommendations Home health PT;Supervision/Assistance - 24 hour    Equipment Recommendations  None recommended by PT    Recommendations for Other Services       Precautions / Restrictions Precautions Precautions: Fall;Knee Precaution Comments: KI to be worn when walking, per Dr. Alvan Dame limit flexion of knee, Currently quads are inactive, Restrictions Weight Bearing Restrictions: No      Mobility  Bed Mobility Overal bed mobility: Needs Assistance Bed Mobility: Supine to Sit     Supine to sit: Min assist     General bed mobility comments: support LLE to floor  Transfers Overall transfer level: Needs assistance Equipment used: Rolling walker (2 wheeled) Transfers: Sit to/from Stand Sit to Stand: Min assist         General transfer comment: cues for safety with RW, cautioning about Quad weakness and potential for  buckling.  Ambulation/Gait Ambulation/Gait assistance: Min assist Ambulation Distance (Feet): 200 Feet Assistive device: Rolling walker (2 wheeled) Gait Pattern/deviations: Step-to pattern;Step-through pattern;Decreased stance time - left     General Gait Details: cues for safety, shorten L step length and  be certain to hold RW when weight bearing on L./  Stairs            Wheelchair Mobility    Modified Rankin (Stroke Patients Only)       Balance                                             Pertinent Vitals/Pain No pain, decreased  sensation in Jerry Mathews expects to be discharged to:: Private residence Living Arrangements: Spouse/significant other Available Help at Discharge: Family Type of Home: House Home Access: Stairs to enter Entrance Stairs-Rails: Right Entrance Stairs-Number of Steps: 6 Home Layout: One level Home Equipment: Environmental consultant - 2 wheels;Bedside commode;Crutches;Cane - single point      Prior Function Level of Independence: Independent               Hand Dominance        Extremity/Trunk Assessment               Lower Extremity Assessment: LLE deficits/detail;RLE deficits/detail   LLE Deficits / Details: Quads inactive after FNB, unable to perform SLR     Communication   Communication: No difficulties  Cognition Arousal/Alertness: Awake/alert Behavior During Therapy: Impulsive Overall Cognitive Status: Within Functional Limits for tasks assessed                      General Comments      Exercises        Assessment/Plan    PT Assessment Patient needs continued PT services  PT Diagnosis Difficulty walking   PT Problem List Decreased strength;Decreased activity tolerance;Decreased safety awareness;Decreased knowledge of precautions;Decreased knowledge of use of DME  PT Treatment Interventions DME instruction;Gait  training;Stair training;Functional mobility training;Therapeutic activities;Therapeutic exercise;Patient/family education   PT Goals (Current goals can be found in the Care Plan section) Acute Rehab PT Goals Patient Stated Goal: to get this fixed and back to being independent PT Goal Formulation: With patient Time For Goal Achievement: 01/22/14 Potential to Achieve Goals: Good    Frequency 7X/week   Barriers to discharge        Co-evaluation               End of Session Equipment Utilized During Treatment: Gait belt Activity Tolerance: Patient tolerated treatment well Patient left: in chair;with call  bell/phone within reach;with family/visitor present Nurse Communication: Mobility status         Time: 2831-5176 PT Time Calculation (min): 44 min   Charges:   PT Evaluation $Initial PT Evaluation Tier I: 1 Procedure PT Treatments $Gait Training: 38-52 mins   PT G Codes:          Claretha Cooper 01/20/2014, 9:39 AM Tresa Endo PT [phone]

## 2014-01-20 NOTE — Op Note (Signed)
NAME:  Jerry Mathews, Jerry Mathews NO.:  [id number]  MEDICAL RECORD NO.:  [id number]  LOCATION:  6222                         FACILITY:  [facility]  PHYSICIAN:  Pietro Cassis. Alvan Dame, M.D.  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  PROCEDURE:  Left knee partial patellectomy with removal of patella button.  SURGEON:  Pietro Cassis. Alvan Dame, M.D.  ASSISTANT:  Danae Orleans, PA-C.  Note that Mr. Jerry Mathews was present for the entirety of the case from preoperative positioning, perioperative management of the operative extremity, general facilitation of the case, and primary wound closure.  ANESTHESIA:  General plus preoperative regional femoral nerve block.  DRAINS:  None.  COMPLICATIONS:  None.  TOURNIQUET TIME:  57 minutes at 250 mmHg.  INDICATIONS FOR PROCEDURE:  [name] is a very pleasant 68 year old male who has been a patient of mine upon consultation for persistently painful knee.  I have revised his knee and patella component within this past year.  He had initially done very well noting significant improvement in his previous surgical intervention.  Unfortunately, he presented recently with about a 4-week history of increasing knee pain that was intermittent at times, but pretty significant at others.  He is a very tough individual and the pain was quite severe.  Radiographically, there looked like there was evidence of a new fracture not only in the inferior pole of the patella, but also in the mid portion.  We had a lengthy discussion about treatment options. I felt at this point that it was best for me to perform an open procedure to better evaluate the status  of the component.  Given the fact that he has a rotating platform knee, other than revising his entire knee, we needed to maintain some form of patella bone structure to prevent any complications from mobile bearing spin out.  Risks of infection, DVT, persistent pain, need for future surgery were all discussed and reviewed.  Consent was obtained for benefit of pain relief.  PROCEDURE IN DETAIL:  The patient was brought to the operative theater. Once adequate anesthesia, preoperative antibiotics, Ancef 3 g administered, he was positioned supine with left thigh tourniquet placed.  Left lower extremity was then prepped and draped in sterile fashion.  Left foot placed in DeMayo leg holder.  Time-out was performed identifying the patient, planned procedure, and extremity.  Leg was exsanguinated, tourniquet elevated to 250 mmHg.  The patient's old incision was incised and subsequently excised.  Soft tissue plane was created.  Median arthrotomy was made encountering clear yellow synovial fluid.  I performed a medial and lateral synovectomy, removal of scarring over the medial and lateral aspect of the joint.  I was then able to evaluate the patella.  Initially, I did not see any complicating features, however, once I was debriding the scar around the patellar button, the patellar button came free easily.  Once this was free, I identified  this nonunion in the midportion and inferior aspect of the patella and decided to shell this portion out maintaining the upper half to two-thirds of the patella.  Once this was shelled out carefully, the patella tendon remained intact with the extensor mechanism as the patella is sesamoid in nature, this continuity is important.  Following the above-stated procedures and confirmation and satisfaction with my medial gutter preparation, the superior pouch debridement lateral gutter as well as his patella procedure, I irrigated the knee with about 1500 mL  of normal saline solution.  We then reapproximated the extensor mechanism using a #1 Vicryl in interrupted fashion and then a 0-V-Loc to reinforce this.  The remainder of the wound was closed with 2-0 Vicryl and then a running 3-0 Monocryl.  The knee was cleaned, dried, and dressed sterilely using Dermabond and Aquacel dressing.  He was then brought to the recovery room in a knee immobilizer in place based on femoral nerve block and his extensor mechanism procedure.  Findings reviewed with family and with the patient.     Pietro Cassis Alvan Dame, M.D.     MDO/MEDQ  D:  01/19/2014  T:  01/20/2014  Job:  [id number]

## 2014-01-20 NOTE — Evaluation (Signed)
Occupational Therapy Evaluation Patient Details Name: Jerry Mathews MRN: [id number] DOB: 10-Jan-1946 Today's Date: 01/20/2014    History of Present Illness L patella fx, revision   Clinical Impression   Pt and wife educated on safe techniques with all self care tasks. Wife will be assisting with LB tasks but educated on AE options if interested. Educated pt on need to slow down for safety with tasks and make sure to hold to walker and alternate letting go with each UE for pulling up clothing. Will benefit from continued OT while on acute to improve safety and independence with self care tasks.    Follow Up Recommendations  No OT follow up;Supervision/Assistance - 24 hour    Equipment Recommendations  None recommended by OT    Recommendations for Other Services       Precautions / Restrictions Precautions Precautions: Fall;Knee Precaution Comments: KI to be worn when walking, per Dr. Alvan Dame limit flexion of knee, Currently quads are inactive, Restrictions Weight Bearing Restrictions: No      Mobility  Transfers Overall transfer level: Needs assistance Equipment used: Rolling walker (2 wheeled) Transfers: Sit to/from Stand Sit to Stand: Min assist         General transfer comment: verbal cues for hand placement.    Balance                                            ADL Overall ADL's : Needs assistance/impaired Eating/Feeding: Independent;Sitting   Grooming: Wash/dry hands;Set up;Sitting   Upper Body Bathing: Set up;Sitting   Lower Body Bathing: Moderate assistance;Sit to/from stand Lower Body Bathing Details (indicate cue type and reason): min assist sit to stand Upper Body Dressing : Set up;Sitting   Lower Body Dressing: Moderate assistance;Sit to/from stand Lower Body Dressing Details (indicate cue type and reason): without AE to help thread clothing over L LE; min assist to stand and pull up clothes Toilet Transfer: Minimal  assistance;BSC;RW Toilet Transfer Details (indicate cue type and reason): steps across the room to [facility] and back to chair Toileting- Clothing Manipulation and Hygiene: Minimal assistance;Sit to/from stand         General ADL Comments: Wife and daughter present for education. Emphasized need to be extra cautious while L LE is still numb. Educated him on not letting go of walker with bilateral UE simultaneously but rather alternate hand hold on walker to pull up clothes in standing, etc. Pt needs min verbal cues for hand placement with transitions on and off BSC/chair. Discussed LB dressing techniques and wife plans to assist with LB self care. Discussed when to wear KI and need to keep L knee straight. Also cautioned pt to make sure he slows down a bit with activity for safety.      Vision                     Perception     Praxis      Pertinent Vitals/Pain No complaint of     Hand Dominance     Extremity/Trunk Assessment Upper Extremity Assessment Upper Extremity Assessment: Overall WFL for tasks assessed          Communication Communication Communication: No difficulties   Cognition Arousal/Alertness: Awake/alert Behavior During Therapy: WFL for tasks assessed/performed Overall Cognitive Status: Within Functional Limits for tasks assessed  General Comments       Exercises       Shoulder Instructions      Home Living Family/patient expects to be discharged to:: Private residence Living Arrangements: Spouse/significant other Available Help at Discharge: Family Type of Home: House Home Access: Stairs to enter Technical brewer of Steps: 6 Entrance Stairs-Rails: Right Home Layout: One level         Bathroom Toilet: Handicapped height     Home Equipment: Environmental consultant - 2 wheels;Bedside commode;Crutches;Cane - single point;Adaptive equipment Adaptive Equipment: Reacher;Long-handled shoe horn        Prior  Functioning/Environment Level of Independence: Independent             OT Diagnosis: Generalized weakness   OT Problem List: Decreased strength;Decreased knowledge of use of DME or AE   OT Treatment/Interventions: Self-care/ADL training;Patient/family education;Therapeutic activities;DME and/or AE instruction    OT Goals(Current goals can be found in the care plan section) Acute Rehab OT Goals Patient Stated Goal: return to independence OT Goal Formulation: With patient/family Time For Goal Achievement: 01/27/14 Potential to Achieve Goals: Good  OT Frequency: Min 2X/week   Barriers to D/C:            Co-evaluation              End of Session Equipment Utilized During Treatment: Gait belt;Rolling walker  Activity Tolerance: Patient tolerated treatment well Patient left: in chair;with call bell/phone within reach;with family/visitor present   Time: 1028-1050 OT Time Calculation (min): 22 min Charges:  OT General Charges $OT Visit: 1 Procedure OT Evaluation $Initial OT Evaluation Tier I: 1 Procedure OT Treatments $Therapeutic Activity: 8-22 mins G-Codes:    Alycia Patten Ivet Guerrieri [phone] 01/20/2014, 11:00 AM

## 2014-01-20 NOTE — Progress Notes (Signed)
Physical Therapy Treatment Patient Details Name: Jerry Mathews MRN: [id number] DOB: 1946-06-28 Today's Date: 01/20/2014    History of Present Illness L patella fx, revision    PT Comments    Pt continues to have weak quads, frequent cues for safe negotiation of steps. Plans Dc to home w/ HHPT  Follow Up Recommendations  Home health PT;Supervision/Assistance - 24 hour     Equipment Recommendations  None recommended by PT    Recommendations for Other Services       Precautions / Restrictions Precautions Precautions: Fall;Knee Precaution Comments: KI to be worn when walking, per Dr. Alvan Dame limit flexion of knee, Currently quads are inactive, Restrictions Weight Bearing Restrictions: No    Mobility  Bed Mobility Overal bed mobility: Needs Assistance Bed Mobility: Supine to Sit     Supine to sit: Min assist     General bed mobility comments: support LLE to floor  Transfers Overall transfer level: Needs assistance Equipment used: Rolling walker (2 wheeled) Transfers: Sit to/from Stand Sit to Stand: Supervision         General transfer comment: verbal cues for hand placement., cues for safety  Ambulation/Gait Ambulation/Gait assistance: Min guard Ambulation Distance (Feet): 50 Feet Assistive device: Rolling walker (2 wheeled) Gait Pattern/deviations: Step-to pattern     General Gait Details: L knee remains buckling with KI, did get a longer KI for safety.   Stairs Stairs: Yes Stairs assistance: Mod assist Stair Management: One rail Right;Step to pattern;Forwards;With crutches Number of Stairs: 6 General stair comments: frequent cues for safety asnd sequence. Pt needs close supervision and verbal cues for proper technique.  Wheelchair Mobility    Modified Rankin (Stroke Patients Only)       Balance                                    Cognition Arousal/Alertness: Awake/alert Behavior During Therapy: Impulsive Overall Cognitive  Status: Within Functional Limits for tasks assessed                      Exercises      General Comments General comments (skin integrity, edema, etc.): min assist standing balance to pull up clothing.      Pertinent Vitals/Pain Beginning to have some pain    Home Living Family/patient expects to be discharged to:: Private residence Living Arrangements: Spouse/significant other Available Help at Discharge: Family Type of Home: House Home Access: Stairs to enter Entrance Stairs-Rails: Right Home Layout: One [place]: Environmental consultant - 2 wheels;Bedside commode;Crutches;Cane - single point;Adaptive equipment      Prior Function Level of Independence: Independent          PT Goals (current goals can now be found in the care plan section) Acute Rehab PT Goals Patient Stated Goal: return to independence PT Goal Formulation: With patient Time For Goal Achievement: 01/22/14 Potential to Achieve Goals: Good Progress towards PT goals: Progressing toward goals    Frequency  7X/week    PT Plan Current plan remains appropriate    Co-evaluation             End of Session Equipment Utilized During Treatment: Gait belt Activity Tolerance: Patient tolerated treatment well Patient left: in chair;with call bell/phone within reach;with family/visitor present     Time: 1227-1248 PT Time Calculation (min): 21 min  Charges:  $Gait Training: 8-22 mins  G Codes:      Claretha Cooper 01/20/2014, 1:24 PM

## 2014-01-20 NOTE — Progress Notes (Signed)
   Subjective: 1 Day Post-Op Procedure(s) (LRB): LEFT KNEE PARTIAL PATTELECTOMY  (Left)   Seen by Dr. Alvan Dame Patient reports pain as mild, pain controlled. No events throughout the night. Discussed limiting flexion of the knee until it heals. Ready to be discharged home.  Objective:   VITALS:   Filed Vitals:   01/20/14 0510  BP: 123/74  Pulse: 59  Temp: 97.6 F (36.4 C)  Resp: 18    Neurovascular intact Dorsiflexion/Plantar flexion intact Incision: dressing C/D/I No cellulitis present Compartment soft  LABS  Recent Labs  01/20/14 0410  HGB 13.1  HCT 37.2*  WBC 5.9  PLT 236     Recent Labs  01/20/14 0410  NA 140  K 4.5  BUN 11  CREATININE 0.83  GLUCOSE 144*     Assessment/Plan: 1 Day Post-Op Procedure(s) (LRB): LEFT KNEE PARTIAL PATTELECTOMY  (Left) Advance diet Up with therapy D/C IV fluids Discharge home with home health Follow up in 2 weeks at [facility]. Follow up with OLIN,Duaa Stelzner D in 2 weeks.  Contact information:  [facility] [address], [place] [phone]    Obese (BMI 30-39.9)  Estimated body mass index is 32.25 kg/(m^2) as calculated from the following:   Height as of this encounter: 6\' 5"  (1.956 m).   Weight as of this encounter: 123.378 kg (272 lb). Patient also counseled that weight may inhibit the healing process Patient counseled that losing weight will help with future health issues        [place] Pugh. Raniah Karan   PAC  01/20/2014, 8:09 AM

## 2014-01-20 NOTE — Progress Notes (Addendum)
[id number]/IOMBT to patient concerning hhc has used [facility] in the past out of [place] and has already contacted them for home p.t. Will follow up/Rhonda Davis,RN,BSN,CCM Called Debbie with [facility], vm full, sent text message alerting of [facility] -pt needed and patient being dcd today.

## 2014-01-20 NOTE — Discharge Summary (Signed)
Physician Discharge Summary  Patient ID: Jerry Mathews MRN: [id number] DOB/AGE: Aug 25, 1946 68 y.o.  Admit date: 01/19/2014 Discharge date: 01/20/2014   Procedures:  Procedure(s) (LRB): LEFT KNEE PARTIAL PATTELECTOMY  (Left)  Attending Physician:  Dr. Paralee Mathews   Admission Diagnoses:   Left patella fracture s/p TKA   Discharge Diagnoses:  Principal Problem:   Left patella fracture Active Problems:   Obese  Past Medical History  Diagnosis Date  . Diverticulosis   . Anxiety   . Asthma   . Hypertension   . Arthritis   . Hypothyroidism   . Neuropathy of both feet   . OSA on CPAP   . History of colon polyps     ADENOMATOUS  . OA (osteoarthritis) of knee     RIGHT  . Right knee meniscal tear   . Prostate cancer     [facility] [place]   . Depression     PTSD-- MEDICATIONS HELP- AND HIS DOG HELPS; [place]. HAS SOME MEMORY PROBLEMS WHICH PT AND HIS WIFE THINK MAY BE RELATED TO PTSD    HPI: Pt is a 68 y.o. male complaining of left knee pain for 6 weeks. Pain has been continual since the beginning. X-rays in the clinic show a previous total knee arthroplasty with a fractured thin patella. Dr. Alvan Mathews has previously revised the patella component do to the poly piece loosening on January 06, 2013. His previous surgeries on the left knee include the index TKA in 2008 by Dr. Hal Mathews and a subsequent revision of the knee in 2012. Pt has tried various conservative treatments which have failed to alleviate their symptoms, including assistance, activity modification, NSAIDs and/ analgesic medications. Various options are discussed with the patient and he wishes to proceed with the above precedure. Risks, benefits and expectations were discussed with the patient. Risks including but not limited to the risk of anesthesia, blood clots, nerve damage, blood vessel damage, failure of the prosthesis, infection and up to and including  death. Patient understand the risks, benefits and expectations and wishes to proceed with surgery.   PCP: Jerry Cower, MD   Discharged Condition: good  Hospital Course:  Patient underwent the above stated procedure on 01/19/2014. Patient tolerated the procedure well and brought to the recovery room in good condition and subsequently to the floor.  POD #1 BP: 123/74 ; Pulse: 59 ; Temp: 97.6 F (36.4 C) ; Resp: 18 Patient reports pain as mild, pain controlled. No events throughout the night. Discussed limiting flexion of the knee until it heals. Ready to be discharged home. Neurovascular intact, dorsiflexion/plantar flexion intact, incision: dressing C/D/I, no cellulitis present and compartment soft.   LABS  Basename    HGB  13.1  HCT  37.2    Discharge Exam: General appearance: alert, cooperative and no distress Extremities: Homans sign is negative, no sign of DVT, no edema, redness or tenderness in the calves or thighs and no ulcers, gangrene or trophic changes  Disposition: Home with follow up in 2 weeks   Follow-up Information   Follow up with Jerry Pole, MD. Schedule an appointment as soon as possible for a visit in 2 weeks.   Specialty:  Orthopedic Surgery   Contact information:   [address] [place] [address] [phone]       Discharge Orders   Future Orders Complete By Expires   Call MD / Call 911  As directed  Change dressing  As directed    Constipation Prevention  As directed    Diet - low sodium heart healthy  As directed    Discharge instructions  As directed    Driving restrictions  As directed    Increase activity slowly as tolerated  As directed    TED hose  As directed    Weight bearing as tolerated  As directed    Questions:     Laterality:     Extremity:          Medication List         albuterol 108 (90 BASE) MCG/ACT inhaler  Commonly known as:  PROVENTIL HFA;VENTOLIN HFA  Inhale 1-2 puffs into the lungs every  6 (six) hours as needed for wheezing or shortness of breath.     aspirin 325 MG EC tablet  Take 1 tablet (325 mg total) by mouth 2 (two) times daily.     DSS 100 MG Caps  Take 100 mg by mouth 2 (two) times daily.     ferrous sulfate 325 (65 FE) MG tablet  Take 1 tablet (325 mg total) by mouth 3 (three) times daily after meals.     gabapentin 300 MG capsule  Commonly known as:  NEURONTIN  Take 600-900 mg by mouth 3 (three) times daily. 600MG   AM/ 600 mg lunch   900MG  HS     HYDROcodone-acetaminophen 7.5-325 MG per tablet  Commonly known as:  NORCO  Take 1-2 tablets by mouth every 4 (four) hours as needed for moderate pain.     levothyroxine 88 MCG tablet  Commonly known as:  SYNTHROID, LEVOTHROID  Take 88 mcg by mouth daily before breakfast.     lisinopril 10 MG tablet  Commonly known as:  PRINIVIL,ZESTRIL  Take 10 mg by mouth daily before breakfast.     methocarbamol 500 MG tablet  Commonly known as:  ROBAXIN  Take 1 tablet (500 mg total) by mouth every 6 (six) hours as needed for muscle spasms.     nortriptyline 50 MG capsule  Commonly known as:  PAMELOR  Take 50 mg by mouth at bedtime.     polyethylene glycol packet  Commonly known as:  MIRALAX / GLYCOLAX  Take 17 g by mouth 2 (two) times daily.     traZODone 150 MG tablet  Commonly known as:  DESYREL  Take by mouth at bedtime.         Signed: [place] Mathews. Jerry Mathews   PAC  01/20/2014, 4:29 PM

## 2014-11-10 IMAGING — CR DG CHEST 2V
2 series · 2 of 2 positions shown · non-contrast
Comparison: DG CHEST 2 VIEW dated 12/31/2012

CLINICAL DATA: PREOP ORIF LEFT KNEE PATELLA  VS PATELLECTOMY

EXAM:
CHEST  2 VIEW

[w chest pa]
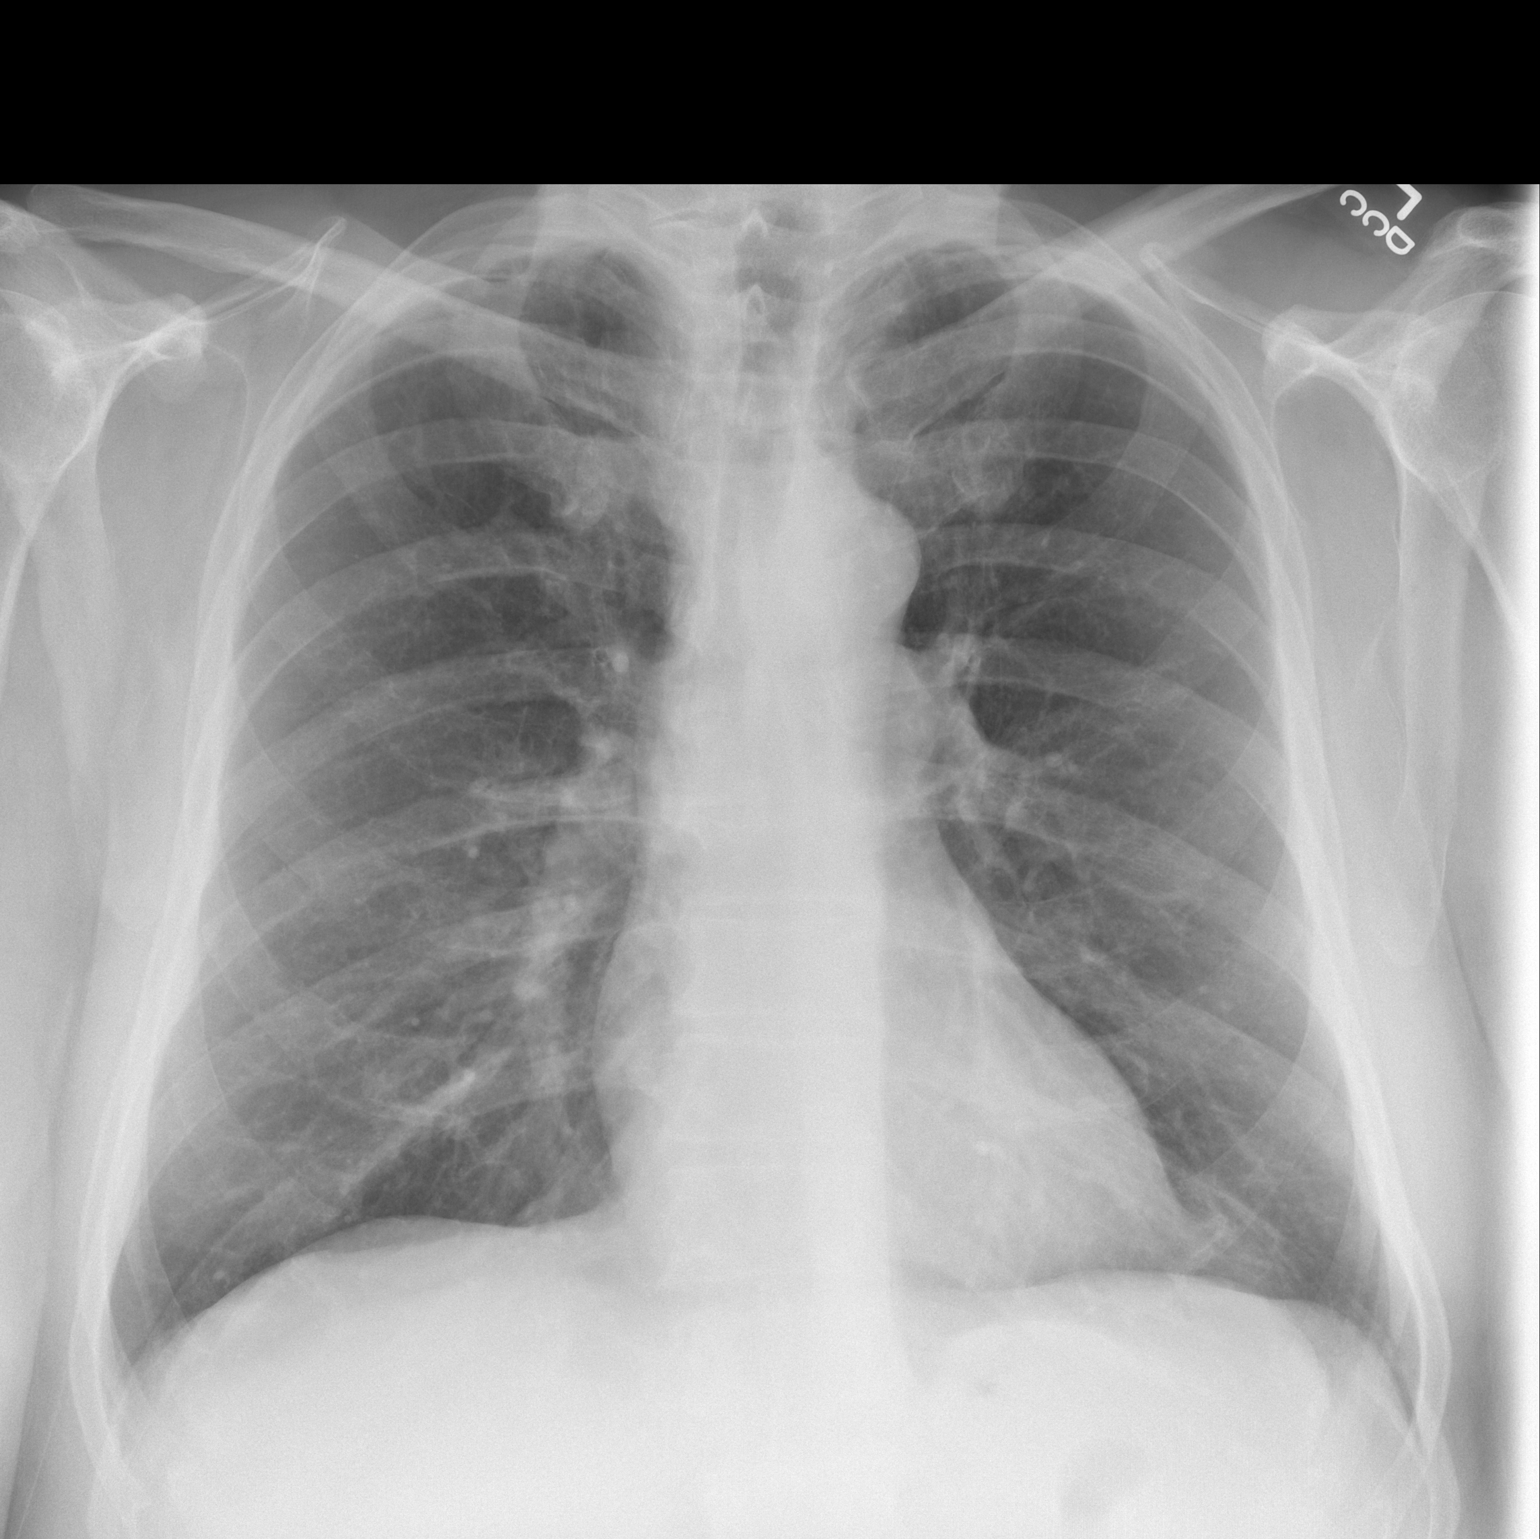

[w chest lat]
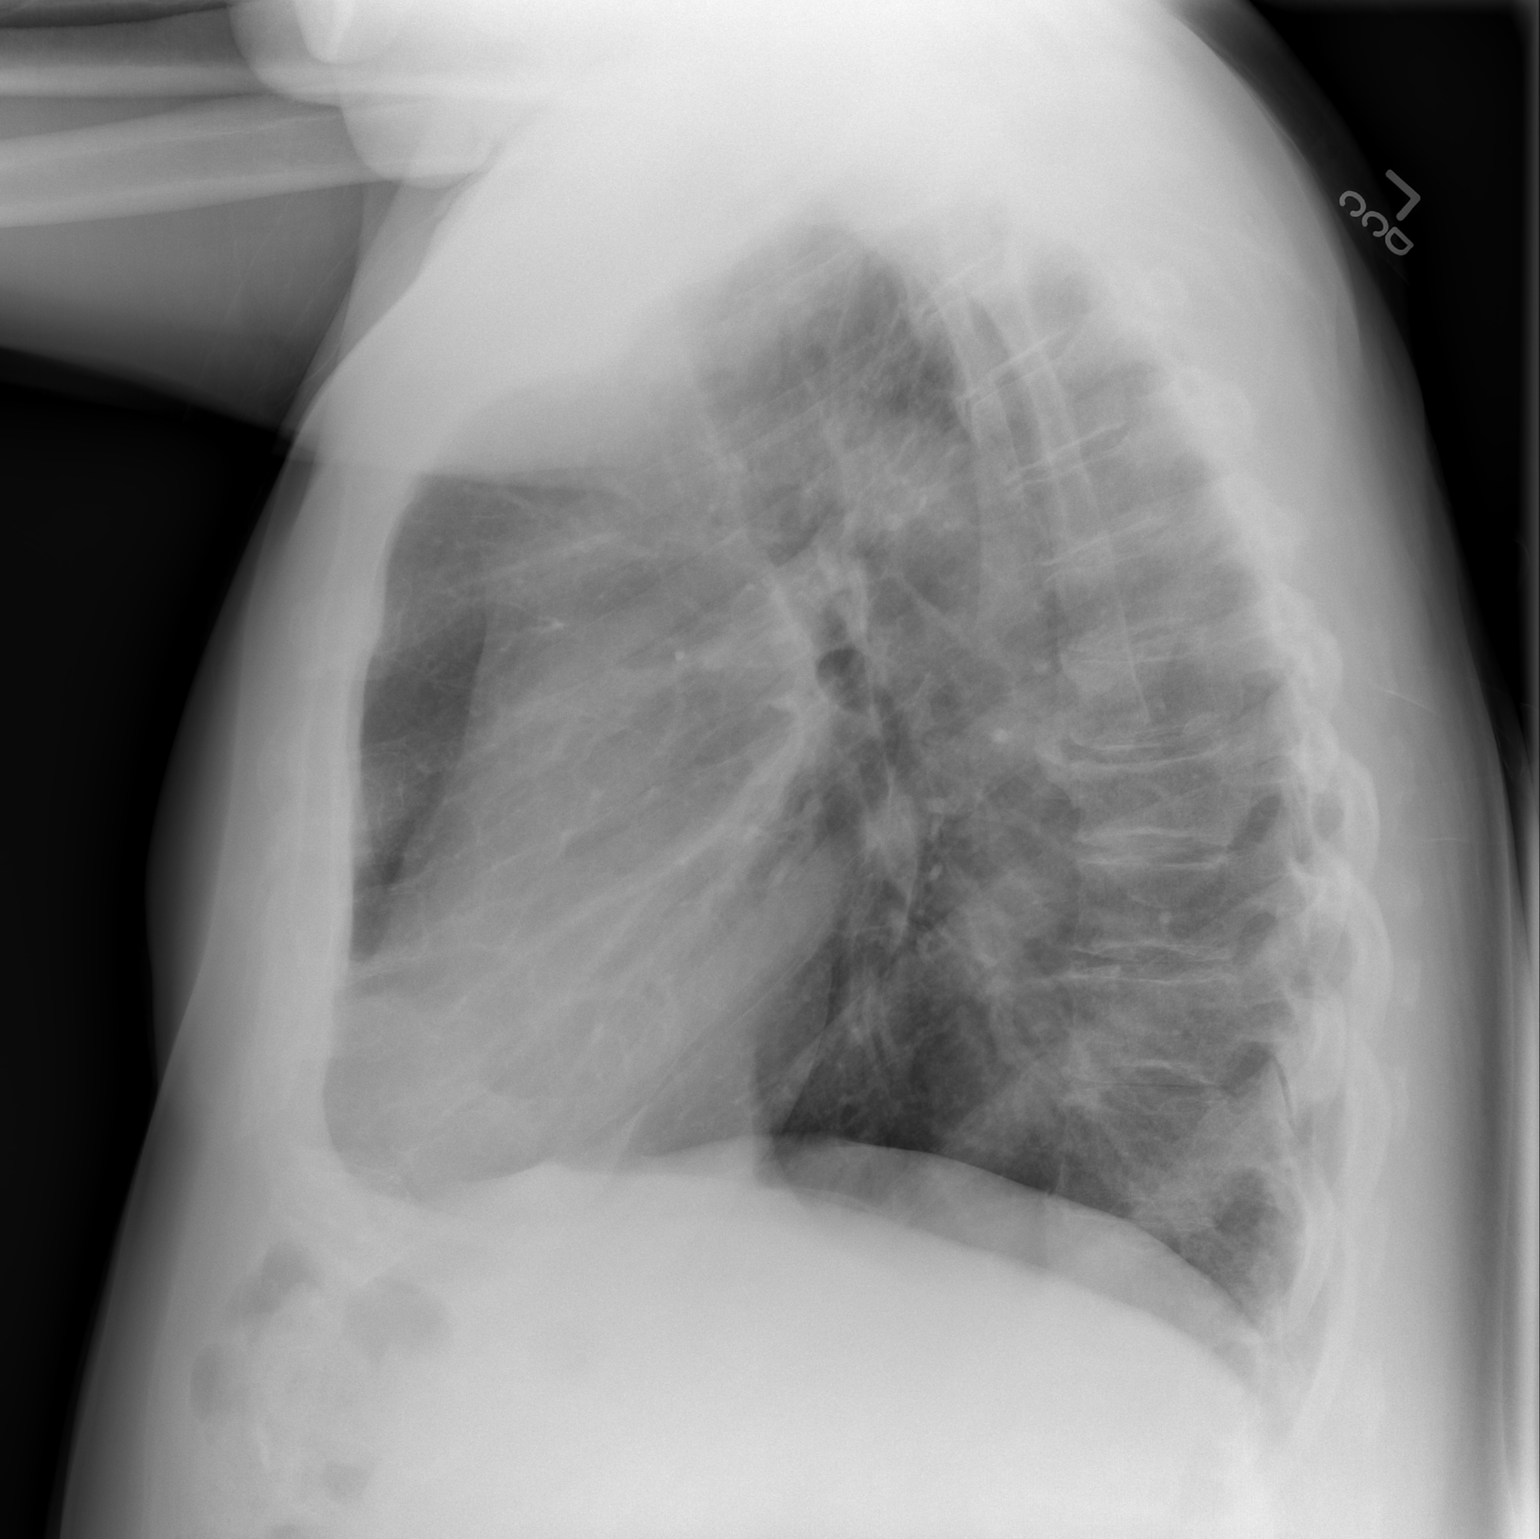

[2 of 2 positions shown; findings below may reference images not displayed]

FINDINGS: Normal mediastinum and cardiac silhouette. Normal pulmonary
vasculature. No evidence of effusion, infiltrate, or pneumothorax.
No acute bony abnormality. Degenerative osteophytosis of the
thoracic spine.
IMPRESSION: No acute cardiopulmonary process.

## 2015-04-23 ENCOUNTER — Encounter: Payer: Self-pay | Admitting: Internal Medicine

## 2015-10-27 DIAGNOSIS — M9902 Segmental and somatic dysfunction of thoracic region: Secondary | ICD-10-CM | POA: Diagnosis not present

## 2015-10-27 DIAGNOSIS — M9905 Segmental and somatic dysfunction of pelvic region: Secondary | ICD-10-CM | POA: Diagnosis not present

## 2015-10-27 DIAGNOSIS — M9904 Segmental and somatic dysfunction of sacral region: Secondary | ICD-10-CM | POA: Diagnosis not present

## 2015-10-27 DIAGNOSIS — M9903 Segmental and somatic dysfunction of lumbar region: Secondary | ICD-10-CM | POA: Diagnosis not present

## 2015-12-25 DIAGNOSIS — J069 Acute upper respiratory infection, unspecified: Secondary | ICD-10-CM | POA: Diagnosis not present

## 2016-03-17 DIAGNOSIS — S81812A Laceration without foreign body, left lower leg, initial encounter: Secondary | ICD-10-CM | POA: Diagnosis not present

## 2016-03-23 DIAGNOSIS — J209 Acute bronchitis, unspecified: Secondary | ICD-10-CM | POA: Diagnosis not present

## 2016-03-31 DIAGNOSIS — J209 Acute bronchitis, unspecified: Secondary | ICD-10-CM | POA: Diagnosis not present

## 2016-04-03 DIAGNOSIS — J209 Acute bronchitis, unspecified: Secondary | ICD-10-CM | POA: Diagnosis not present

## 2016-06-18 ENCOUNTER — Encounter (HOSPITAL_COMMUNITY): Payer: Self-pay

## 2016-06-18 ENCOUNTER — Emergency Department (HOSPITAL_COMMUNITY)
Admission: EM | Admit: 2016-06-18 | Discharge: 2016-06-18 | Disposition: A | Discharge status: Home / Self Care | Payer: PPO | Attending: Emergency Medicine | Admitting: Emergency Medicine

## 2016-06-18 DIAGNOSIS — Z87891 Personal history of nicotine dependence: Secondary | ICD-10-CM | POA: Insufficient documentation

## 2016-06-18 DIAGNOSIS — Y999 Unspecified external cause status: Secondary | ICD-10-CM | POA: Insufficient documentation

## 2016-06-18 DIAGNOSIS — S60511A Abrasion of right hand, initial encounter: Secondary | ICD-10-CM | POA: Insufficient documentation

## 2016-06-18 DIAGNOSIS — I10 Essential (primary) hypertension: Secondary | ICD-10-CM | POA: Insufficient documentation

## 2016-06-18 DIAGNOSIS — Y92481 Parking lot as the place of occurrence of the external cause: Secondary | ICD-10-CM | POA: Insufficient documentation

## 2016-06-18 DIAGNOSIS — Z8546 Personal history of malignant neoplasm of prostate: Secondary | ICD-10-CM | POA: Insufficient documentation

## 2016-06-18 DIAGNOSIS — Y939 Activity, unspecified: Secondary | ICD-10-CM | POA: Diagnosis not present

## 2016-06-18 DIAGNOSIS — W010XXA Fall on same level from slipping, tripping and stumbling without subsequent striking against object, initial encounter: Secondary | ICD-10-CM | POA: Insufficient documentation

## 2016-06-18 DIAGNOSIS — W19XXXA Unspecified fall, initial encounter: Secondary | ICD-10-CM

## 2016-06-18 DIAGNOSIS — S80211A Abrasion, right knee, initial encounter: Secondary | ICD-10-CM | POA: Insufficient documentation

## 2016-06-18 DIAGNOSIS — E039 Hypothyroidism, unspecified: Secondary | ICD-10-CM | POA: Insufficient documentation

## 2016-06-18 DIAGNOSIS — J449 Chronic obstructive pulmonary disease, unspecified: Secondary | ICD-10-CM | POA: Diagnosis not present

## 2016-06-18 DIAGNOSIS — Z96652 Presence of left artificial knee joint: Secondary | ICD-10-CM | POA: Insufficient documentation

## 2016-06-18 DIAGNOSIS — S80212A Abrasion, left knee, initial encounter: Secondary | ICD-10-CM | POA: Insufficient documentation

## 2016-06-18 DIAGNOSIS — S0081XA Abrasion of other part of head, initial encounter: Secondary | ICD-10-CM | POA: Diagnosis not present

## 2016-06-18 DIAGNOSIS — S0993XA Unspecified injury of face, initial encounter: Secondary | ICD-10-CM | POA: Diagnosis not present

## 2016-06-18 NOTE — ED Triage Notes (Signed)
Patient fell while walking through parking lot of hospital. [address] forward striking nose and chin, no loc. Also has small abrasion to hands, no loc. NAD

## 2016-06-18 NOTE — ED Provider Notes (Signed)
[facility] DEPT Provider Note   CSN: [id number] Arrival date & time: 06/18/16  1358   By signing my name below, I, Royce Macadamia, attest that this documentation has been prepared under the direction and in the presence of  [facility], PA-C. Electronically Signed: Royce Macadamia, ED Scribe. 06/18/16. 3:38 PM.  History   Chief Complaint Chief Complaint  Patient presents with  . Fall   The history is provided by the patient and medical records. No language interpreter was used.   HPI Comments:  Jerry Mathews is a 70 y.o. male who presents to the Emergency Department s/p mechanical fall x 2 today complaining of minimal abrasions to his face.  He reports that the first fall was around 1100 and he tripped on a step and fell forward on a step in his home.  The second time was just PTA and he tripped on a curb in the hospital parking lot while going to visit his wife and fell forward.  He denies pain in his face, back pain, neck pain, chest pain, light headedness, LOC, headache, numbness, weakness and changes in vision.  Denies any pain at all and states he only hurt his "pride."  Denies any symptoms prior to the falls, denies weakness, lightheadedness, dizziness, syncope.  Has not been sick, has been feeling perfectly well.  No complaints.     Past Medical History:  Diagnosis Date  . Anxiety   . Arthritis   . Asthma   . Depression    PTSD-- MEDICATIONS HELP- AND HIS DOG HELPS; [place]. HAS SOME MEMORY PROBLEMS WHICH PT AND HIS WIFE THINK MAY BE RELATED TO PTSD  . Diverticulosis   . History of colon polyps    ADENOMATOUS  . Hypertension   . Hypothyroidism   . Neuropathy of both feet ([place])   . OA (osteoarthritis) of knee    RIGHT  . OSA on CPAP   . Prostate cancer ([place])    [facility] [place]   . Right knee meniscal tear     Patient Active Problem List   Diagnosis Date Noted  . Obese 01/20/2014  .  Left patella fracture 01/19/2014  . S/P left patella revision 01/06/2013  . HYPOTHYROIDISM 07/29/2010  . ASTHMA 07/29/2010  . HYPERTENSION 05/28/2007  . COPD 05/28/2007    Past Surgical History:  Procedure Laterality Date  . CHONDROPLASTY Right 08/21/2013   Procedure:  MEDIAL PETELLAR CHONDROPLASTY;  Surgeon: Mauri Pole, MD;  Location: [facility];  Service: Orthopedics;  Laterality: Right;  . HAND SURGERY Left 2009  . KNEE ARTHROSCOPY Left 06-03-2004  &  07-12-2006   DEBRIDEMENT OF MENISCUS, PARTIAL ACL TEAR AND CHONDROMALACIA  . KNEE ARTHROSCOPY WITH LATERAL MENISECTOMY Right 08/21/2013   Procedure: KNEE ARTHROSCOPY WITH LATERAL MENISECTOMY;  Surgeon: Mauri Pole, MD;  Location: [facility];  Service: Orthopedics;  Laterality: Right;  . KNEE ARTHROSCOPY WITH MEDIAL MENISECTOMY Right 08/21/2013   Procedure: KNEE ARTHROSCOPY WITH MEDIAL MENISECTOMY;  Surgeon: Mauri Pole, MD;  Location[facility];  Service: Orthopedics;  Laterality: Right;  . MANDIBLE RECONSTRUCTION  1976  . ORIF PATELLA Left 01/19/2014   Procedure: LEFT KNEE PARTIAL PATTELECTOMY ;  Surgeon: Mauri Pole, MD;  Location: [facility];  Service: Orthopedics;  Laterality: Left;  . REVISION PATELLA OF LEFT TOTAL KNEE  05-01-2011  . TOTAL KNEE ARTHROPLASTY Left 11-12-2006   AND 02-05-2007  CLOSED MANIPULATION LEFT KNEE  .  TOTAL KNEE REVISION Left 01/06/2013   Procedure: PATELLA COMPONENT REVISION / removal and replacement of patella component ;  Surgeon: Mauri Pole, MD;  Location: [facility];  Service: Orthopedics;  Laterality: Left;  pt. also has a femoral nerve block in left leg.       Home Medications    Prior to Admission medications   Medication Sig Start Date End Date Taking? Authorizing Provider  albuterol (PROVENTIL HFA;VENTOLIN HFA) 108 (90 BASE) MCG/ACT inhaler Inhale 1-2 puffs into the lungs every 6 (six) hours as needed for wheezing or shortness of breath.      Historical Provider, MD  docusate sodium 100 MG CAPS Take 100 mg by mouth 2 (two) times daily. 01/20/14   Danae Orleans, PA-C  ferrous sulfate 325 (65 FE) MG tablet Take 1 tablet (325 mg total) by mouth 3 (three) times daily after meals. 01/20/14   Danae Orleans, PA-C  gabapentin (NEURONTIN) 300 MG capsule Take 600-900 mg by mouth 3 (three) times daily. 600MG   AM/ 600 mg lunch   900MG  HS    Historical Provider, MD  HYDROcodone-acetaminophen (NORCO) 7.5-325 MG per tablet Take 1-2 tablets by mouth every 4 (four) hours as needed for moderate pain. 01/20/14   Danae Orleans, PA-C  levothyroxine (SYNTHROID, LEVOTHROID) 88 MCG tablet Take 88 mcg by mouth daily before breakfast.     Historical Provider, MD  lisinopril (PRINIVIL,ZESTRIL) 10 MG tablet Take 10 mg by mouth daily before breakfast.     Historical Provider, MD  methocarbamol (ROBAXIN) 500 MG tablet Take 1 tablet (500 mg total) by mouth every 6 (six) hours as needed for muscle spasms. 01/20/14   Danae Orleans, PA-C  nortriptyline (PAMELOR) 50 MG capsule Take 50 mg by mouth at bedtime.    Historical Provider, MD  polyethylene glycol (MIRALAX / GLYCOLAX) packet Take 17 g by mouth 2 (two) times daily. 01/20/14   Danae Orleans, PA-C  traZODone (DESYREL) 150 MG tablet Take by mouth at bedtime.    Historical Provider, MD    Family History Family History  Problem Relation Age of Onset  . Heart disease Mother   . Colon cancer Neg Hx   . Esophageal cancer Neg Hx   . Rectal cancer Neg Hx   . Stomach cancer Neg Hx     Social History Social History  Substance Use Topics  . Smoking status: Former Smoker    Packs/day: 1.00    Years: 25.00    Types: Cigarettes    Quit date: 12/31/1993  . Smokeless tobacco: Never Used  . Alcohol use 0.0 oz/week     Comment: occasional     Allergies   Review of patient's allergies indicates no known allergies.   Review of Systems Review of Systems  Constitutional: Negative for activity change and appetite  change.  HENT: Negative for congestion, nosebleeds and trouble swallowing.   Eyes: Negative for visual disturbance.  Respiratory: Negative for shortness of breath.   Cardiovascular: Negative for chest pain.  Gastrointestinal: Negative for abdominal pain.  Musculoskeletal: Negative for back pain, myalgias and neck pain.  Skin: Positive for wound.  Neurological: Negative for syncope, weakness, numbness and headaches.  Psychiatric/Behavioral: Negative for confusion and self-injury.     Physical Exam Updated Vital Signs BP 110/66 (BP Location: Left Arm)   Pulse 64   Temp 98.2 F (36.8 C) (Oral)   Resp 14   SpO2 96%   Physical Exam  Constitutional: He appears well-developed and well-nourished. No distress.  HENT:  Head:  Normocephalic. Head is with abrasion.    See skin exam.  Multiple abrasions.  No focal bony tenderness.    Neck: Neck supple.  Pulmonary/Chest: Effort normal. He exhibits no tenderness.  Abdominal: Soft. He exhibits no distension. There is no tenderness.  Musculoskeletal:  Spine nontender, no crepitus, or stepoffs.   Neurological: He is alert.  CN II-XII intact, EOMs intact, no pronator drift, grip strengths equal bilaterally; strength 5/5 in all extremities, sensation intact in all extremities; finger to nose, heel to shin, rapid alternating movements normal; gait is normal.     Skin: He is not diaphoretic.  Multiple abrasions on face, anterior knees and legs, right hand.  No active bleeding.  No lacerations.    Nursing note and vitals reviewed.    ED Treatments / Results  DIAGNOSTIC STUDIES:    COORDINATION OF CARE:  3:38 PM Discussed treatment plan with pt at bedside and pt agreed to plan.Labs (all labs ordered are listed, but only abnormal results are displayed) Labs Reviewed - No data to display  EKG  EKG Interpretation None       Radiology No results found.  Procedures Procedures (including critical care time)  Medications Ordered in  ED Medications - No data to display   Initial Impression / Assessment and Plan / ED Course  I have reviewed the triage vital signs and the nursing notes.  Pertinent labs & imaging results that were available during my care of the patient were reviewed by me and considered in my medical decision making (see chart for details).  Clinical Course   Afebrile, nontoxic patient with two mechanical falls today.  He denies any symptoms preceding or following the falls.  No syncope.  He is not feeling weak, has not been sick, has no pain anywhere.  Pt also seen by Dr Winfred Leeds who agrees no imaging needed, pt also in agreement.  D/C home with return precautions.  Discussed result, findings, treatment, and follow up  with patient.  Pt given return precautions.  Pt verbalizes understanding and agrees with plan.      Final Clinical Impressions(s) / ED Diagnoses   Final diagnoses:  Fall, initial encounter  Facial abrasion, initial encounter    New Prescriptions Discharge Medication List as of 06/18/2016  4:01 PM      I personally performed the services described in this documentation, which was scribed in my presence. The recorded information has been reviewed and is accurate.    Clayton Bibles, PA-C 06/18/16 [place], MD 06/19/16 [phone]

## 2016-06-18 NOTE — Discharge Instructions (Signed)
Read the information below.  You may return to the Emergency Department at any time for worsening condition or any new symptoms that concern you.  ° °You have had a head injury which does not appear to require admission at this time. A concussion is a state of changed mental ability from trauma. °SEEK IMMEDIATE MEDICAL ATTENTION IF: °There is confusion or drowsiness (although children frequently become drowsy after injury).  °You cannot awaken the injured person.  °There is nausea (feeling sick to your stomach) or continued, forceful vomiting.  °You notice dizziness or unsteadiness which is getting worse, or inability to walk.  °You have convulsions or unconsciousness.  °You experience severe, persistent headaches not relieved by Tylenol?. (Do not take aspirin as this impairs clotting abilities). Take other pain medications only as directed.  °You cannot use arms or legs normally.  °There are changes in pupil sizes. (This is the black center in the colored part of the eye)  °There is clear or bloody discharge from the nose or ears.  °Change in speech, vision, swallowing, or understanding.  °Localized weakness, numbness, tingling, or change in bowel or bladder control.  °

## 2016-06-18 NOTE — ED Provider Notes (Signed)
Patient tripped and fell twice today. First time 11 AM second time approximately 1 PM he struck his face is resolved event. He denies loss of consciousness denies headache denies neck pain no other complaint. On exam patient is alert Glasgow Coma Score 15 in no distress HEENT exam there abrasions about the nose forehead and chin. No scalp hematoma. Neck is supple full range of motion without tenderness. Neurologic Glasgow Coma Score 15 gait normal Romberg normal pronator drift normal motor strength 5 over 5 overall. Imaging not indicated. Discussed with patient who agrees   Orlie Dakin, MD 06/18/16 [phone]

## 2016-06-18 NOTE — ED Notes (Signed)
Declined W/C at D/C and was escorted to lobby by RN. 

## 2016-08-01 DIAGNOSIS — J209 Acute bronchitis, unspecified: Secondary | ICD-10-CM | POA: Diagnosis not present

## 2016-09-06 DIAGNOSIS — J069 Acute upper respiratory infection, unspecified: Secondary | ICD-10-CM | POA: Diagnosis not present

## 2016-09-12 DIAGNOSIS — J209 Acute bronchitis, unspecified: Secondary | ICD-10-CM | POA: Diagnosis not present

## 2016-11-28 DIAGNOSIS — M9904 Segmental and somatic dysfunction of sacral region: Secondary | ICD-10-CM | POA: Diagnosis not present

## 2016-11-28 DIAGNOSIS — M9903 Segmental and somatic dysfunction of lumbar region: Secondary | ICD-10-CM | POA: Diagnosis not present

## 2016-11-28 DIAGNOSIS — M9901 Segmental and somatic dysfunction of cervical region: Secondary | ICD-10-CM | POA: Diagnosis not present

## 2016-11-28 DIAGNOSIS — M9905 Segmental and somatic dysfunction of pelvic region: Secondary | ICD-10-CM | POA: Diagnosis not present

## 2016-12-11 DIAGNOSIS — J9811 Atelectasis: Secondary | ICD-10-CM | POA: Diagnosis not present

## 2016-12-11 DIAGNOSIS — R4781 Slurred speech: Secondary | ICD-10-CM | POA: Diagnosis not present

## 2016-12-11 DIAGNOSIS — G20 Parkinson's disease: Secondary | ICD-10-CM | POA: Diagnosis not present

## 2016-12-11 DIAGNOSIS — R296 Repeated falls: Secondary | ICD-10-CM | POA: Diagnosis not present

## 2016-12-11 DIAGNOSIS — R410 Disorientation, unspecified: Secondary | ICD-10-CM | POA: Diagnosis not present

## 2017-01-18 DIAGNOSIS — R531 Weakness: Secondary | ICD-10-CM | POA: Diagnosis not present

## 2017-01-18 DIAGNOSIS — I10 Essential (primary) hypertension: Secondary | ICD-10-CM | POA: Diagnosis not present

## 2017-01-18 DIAGNOSIS — R42 Dizziness and giddiness: Secondary | ICD-10-CM | POA: Diagnosis not present

## 2017-02-01 DIAGNOSIS — S91112A Laceration without foreign body of left great toe without damage to nail, initial encounter: Secondary | ICD-10-CM | POA: Diagnosis not present

## 2017-07-09 ENCOUNTER — Encounter: Payer: Self-pay | Admitting: Internal Medicine

## 2017-12-11 DIAGNOSIS — R05 Cough: Secondary | ICD-10-CM | POA: Diagnosis not present

## 2017-12-11 DIAGNOSIS — J206 Acute bronchitis due to rhinovirus: Secondary | ICD-10-CM | POA: Diagnosis not present

## 2017-12-17 DIAGNOSIS — J9801 Acute bronchospasm: Secondary | ICD-10-CM | POA: Diagnosis not present

## 2017-12-24 DIAGNOSIS — J209 Acute bronchitis, unspecified: Secondary | ICD-10-CM | POA: Diagnosis not present

## 2018-07-01 DIAGNOSIS — L570 Actinic keratosis: Secondary | ICD-10-CM | POA: Diagnosis not present

## 2018-07-15 DIAGNOSIS — M9903 Segmental and somatic dysfunction of lumbar region: Secondary | ICD-10-CM | POA: Diagnosis not present

## 2018-07-15 DIAGNOSIS — M9905 Segmental and somatic dysfunction of pelvic region: Secondary | ICD-10-CM | POA: Diagnosis not present

## 2018-07-15 DIAGNOSIS — M9902 Segmental and somatic dysfunction of thoracic region: Secondary | ICD-10-CM | POA: Diagnosis not present

## 2018-07-15 DIAGNOSIS — M9904 Segmental and somatic dysfunction of sacral region: Secondary | ICD-10-CM | POA: Diagnosis not present

## 2019-04-14 DIAGNOSIS — Z20828 Contact with and (suspected) exposure to other viral communicable diseases: Secondary | ICD-10-CM | POA: Diagnosis not present

## 2019-05-10 DIAGNOSIS — R0902 Hypoxemia: Secondary | ICD-10-CM | POA: Diagnosis not present

## 2019-05-10 DIAGNOSIS — W19XXXA Unspecified fall, initial encounter: Secondary | ICD-10-CM | POA: Diagnosis not present

## 2019-05-10 DIAGNOSIS — R61 Generalized hyperhidrosis: Secondary | ICD-10-CM | POA: Diagnosis not present

## 2019-05-10 DIAGNOSIS — I959 Hypotension, unspecified: Secondary | ICD-10-CM | POA: Diagnosis not present

## 2019-05-23 DIAGNOSIS — M47812 Spondylosis without myelopathy or radiculopathy, cervical region: Secondary | ICD-10-CM | POA: Diagnosis not present

## 2019-06-24 DIAGNOSIS — M47812 Spondylosis without myelopathy or radiculopathy, cervical region: Secondary | ICD-10-CM | POA: Diagnosis not present

## 2019-08-05 DIAGNOSIS — M47812 Spondylosis without myelopathy or radiculopathy, cervical region: Secondary | ICD-10-CM | POA: Diagnosis not present

## 2020-06-25 DIAGNOSIS — Z1152 Encounter for screening for COVID-19: Secondary | ICD-10-CM | POA: Diagnosis not present

## 2020-06-25 DIAGNOSIS — J449 Chronic obstructive pulmonary disease, unspecified: Secondary | ICD-10-CM | POA: Diagnosis not present

## 2020-06-25 DIAGNOSIS — R05 Cough: Secondary | ICD-10-CM | POA: Diagnosis not present

## 2020-06-25 DIAGNOSIS — J069 Acute upper respiratory infection, unspecified: Secondary | ICD-10-CM | POA: Diagnosis not present

## 2020-07-27 NOTE — Patient Instructions (Addendum)
DUE TO COVID-19 ONLY ONE VISITOR IS ALLOWED TO COME WITH YOU AND STAY IN THE WAITING ROOM ONLY DURING PRE OP AND PROCEDURE DAY OF SURGERY. THE 1 VISITOR  MAY VISIT WITH YOU AFTER SURGERY IN YOUR PRIVATE ROOM DURING VISITING HOURS ONLY!  YOU NEED TO HAVE A COVID 19 TEST ON_10/29______ @__2 :05_____, THIS TEST MUST BE DONE BEFORE SURGERY,  COVID TESTING SITE [address]  [id number], IT IS ON THE RIGHT GOING OUT [address] APPROXIMATELY  2 MINUTES PAST [employer] ON THE RIGHT. ONCE YOUR COVID TEST IS COMPLETED,  PLEASE BEGIN THE QUARANTINE INSTRUCTIONS AS OUTLINED IN YOUR HANDOUT.                Jerry Mathews   Your procedure is scheduled on: 08/03/20   Report to [facility] Main  Entrance   Report to admitting at 11:50 AM     Call this number if you have problems the morning of surgery [place], NO CHEWING GUM [place].   No food after midnight.    You may have clear liquid until 11:00 AM.    CLEAR LIQUID DIET   Foods Allowed                                                                     Foods Excluded  Coffee and tea, regular and decaf                             liquids that you cannot  Plain Jell-O any favor except red or purple                                           see through such as: Fruit ices (not with fruit pulp)                                     milk, soups, orange juice  Iced Popsicles                                    All solid food Carbonated beverages, regular and diet                                    Cranberry, grape and apple juices Sports drinks like Gatorade Lightly seasoned clear broth or consume(fat free) Sugar, honey syrup      At 11:00  AM drink pre surgery drink   Nothing by mouth after 11:00 AM.    Take these medicines the morning of surgery with A SIP OF WATER: Wellbutrin, Sinemet, Tamsulosin, Levothyrovine, use your  eye drops.  Use your inhalers and bring them with you       . Bring your mask and tubing to the hospital  You may not have any metal on your body including              piercings  Do not wear jewelry,  lotions, powders or deodorant              Men may shave face and neck.   Do not bring valuables to the hospital. [place].  Contacts, dentures or bridgework may not be worn into surgery.      Patients discharged the day of surgery will not be allowed to drive home.   IF YOU ARE HAVING SURGERY AND GOING HOME THE SAME DAY, YOU MUST HAVE AN ADULT TO DRIVE YOU HOME AND BE WITH YOU FOR 24 HOURS.   YOU MAY GO HOME BY TAXI OR UBER OR ORTHERWISE, BUT AN ADULT MUST ACCOMPANY YOU HOME AND STAY WITH YOU FOR 24 HOURS.  Name and phone number of your driver:  Special Instructions: N/A              Please read over the following fact sheets you were given: _____________________________________________________________________             [facility] - Preparing for Surgery Before surgery, you can play an important role.   Because skin is not sterile, your skin needs to be as free of germs as possible.   You can reduce the number of germs on your skin by washing with CHG (chlorahexidine gluconate) soap before surgery.   CHG is an antiseptic cleaner which kills germs and bonds with the skin to continue killing germs even after washing. Please DO NOT use if you have an allergy to CHG or antibacterial soaps.   If your skin becomes reddened/irritated stop using the CHG and inform your nurse when you arrive at Short Stay.   You may shave your face/neck.  Please follow these instructions carefully:  1.  Shower with CHG Soap the night before surgery and the  morning of Surgery.  2.  If you choose to wash your hair, wash your hair first as usual with your  normal  shampoo.  3.  After you shampoo, rinse your hair and  body thoroughly to remove the  shampoo.                                        4.  Use CHG as you would any other liquid soap.  You can apply chg directly  to the skin and wash                       Gently with a scrungie or clean washcloth.  5.  Apply the CHG Soap to your body ONLY FROM THE NECK DOWN.   Do not use on face/ open                           Wound or open sores. Avoid contact with eyes, ears mouth and genitals (private parts).                       Wash face,  Genitals (private parts) with your normal soap.             6.  Wash thoroughly, paying special attention  to the area where your surgery  will be performed.  7.  Thoroughly rinse your body with warm water from the neck down.  8.  DO NOT shower/wash with your normal soap after using and rinsing off  the CHG Soap.             9.  Pat yourself dry with a clean towel.            10.  Wear clean pajamas.            11.  Place clean sheets on your bed the night of your first shower and do not  sleep with pets. Day of Surgery : Do not apply any lotions/deodorants the morning of surgery.  Please wear clean clothes to the hospital/surgery center.  FAILURE TO FOLLOW THESE INSTRUCTIONS MAY RESULT IN THE CANCELLATION OF YOUR SURGERY PATIENT SIGNATURE_________________________________  NURSE SIGNATURE__________________________________  ________________________________________________________________________   Jerry Mathews  An incentive spirometer is a tool that can help keep your lungs clear and active. This tool measures how well you are filling your lungs with each breath. Taking long deep breaths may help reverse or decrease the chance of developing breathing (pulmonary) problems (especially infection) following:  A long period of time when you are unable to move or be active. BEFORE THE PROCEDURE   If the spirometer includes an indicator to show your best effort, your nurse or respiratory therapist will set it to a  desired goal.  If possible, sit up straight or lean slightly forward. Try not to slouch.  Hold the incentive spirometer in an upright position. INSTRUCTIONS FOR USE  1. Sit on the edge of your bed if possible, or sit up as far as you can in bed or on a chair. 2. Hold the incentive spirometer in an upright position. 3. Breathe out normally. 4. Place the mouthpiece in your mouth and seal your lips tightly around it. 5. Breathe in slowly and as deeply as possible, raising the piston or the ball toward the top of the column. 6. Hold your breath for 3-5 seconds or for as long as possible. Allow the piston or ball to fall to the bottom of the column. 7. Remove the mouthpiece from your mouth and breathe out normally. 8. Rest for a few seconds and repeat Steps 1 through 7 at least 10 times every 1-2 hours when you are awake. Take your time and take a few normal breaths between deep breaths. 9. The spirometer may include an indicator to show your best effort. Use the indicator as a goal to work toward during each repetition. 10. After each set of 10 deep breaths, practice coughing to be sure your lungs are clear. If you have an incision (the cut made at the time of surgery), support your incision when coughing by placing a pillow or rolled up towels firmly against it. Once you are able to get out of bed, walk around indoors and cough well. You may stop using the incentive spirometer when instructed by your caregiver.  RISKS AND COMPLICATIONS  Take your time so you do not get dizzy or light-headed.  If you are in pain, you may need to take or ask for pain medication before doing incentive spirometry. It is harder to take a deep breath if you are having pain. AFTER USE  Rest and breathe slowly and easily.  It can be helpful to keep track of a log of your progress. Your caregiver can provide you with a simple table  to help with this. If you are using the spirometer at home, follow these  instructions: [place] IF:   You are having difficultly using the spirometer.  You have trouble using the spirometer as often as instructed.  Your pain medication is not giving enough relief while using the spirometer.  You develop fever of 100.5 F (38.1 C) or higher. SEEK IMMEDIATE MEDICAL CARE IF:   You cough up bloody sputum that had not been present before.  You develop fever of 102 F (38.9 C) or greater.  You develop worsening pain at or near the incision site. MAKE SURE YOU:   Understand these instructions.  Will watch your condition.  Will get help right away if you are not doing well or get worse. Document Released: 01/29/2007 Document Revised: 12/11/2011 Document Reviewed: 04/01/2007 [facility] Patient Information 2014 [place], [place].   ________________________________________________________________________

## 2020-07-28 ENCOUNTER — Encounter (HOSPITAL_COMMUNITY): Payer: Self-pay

## 2020-07-28 ENCOUNTER — Encounter (HOSPITAL_COMMUNITY)
Admission: RE | Admit: 2020-07-28 | Discharge: 2020-07-28 | Disposition: A | Discharge status: Home / Self Care | Payer: No Typology Code available for payment source | Source: Ambulatory Visit | Attending: Orthopedic Surgery | Admitting: Orthopedic Surgery

## 2020-07-28 ENCOUNTER — Other Ambulatory Visit: Payer: Self-pay

## 2020-07-28 DIAGNOSIS — I10 Essential (primary) hypertension: Secondary | ICD-10-CM | POA: Diagnosis not present

## 2020-07-28 DIAGNOSIS — Z01818 Encounter for other preprocedural examination: Secondary | ICD-10-CM | POA: Diagnosis not present

## 2020-07-28 HISTORY — DX: Post-traumatic stress disorder, unspecified: F43.10

## 2020-07-28 LAB — BASIC METABOLIC PANEL WITH GFR
Anion gap: 8 (ref 5–15)
BUN: 17 mg/dL (ref 8–23)
CO2: 27 mmol/L (ref 22–32)
Calcium: 9.4 mg/dL (ref 8.9–10.3)
Chloride: 103 mmol/L (ref 98–111)
Creatinine, Ser: 0.83 mg/dL (ref 0.61–1.24)
GFR, Estimated: >60 mL/min
Glucose, Bld: 96 mg/dL (ref 70–99)
Potassium: 4.3 mmol/L (ref 3.5–5.1)
Sodium: 138 mmol/L (ref 135–145)

## 2020-07-28 LAB — CBC
HCT: 46.2 % (ref 39.0–52.0)
Hemoglobin: 15.6 g/dL (ref 13.0–17.0)
MCH: 31.1 pg (ref 26.0–34.0)
MCHC: 33.8 g/dL (ref 30.0–36.0)
MCV: 92 fL (ref 80.0–100.0)
Platelets: 219 K/uL (ref 150–400)
RBC: 5.02 MIL/uL (ref 4.22–5.81)
RDW: 13.1 % (ref 11.5–15.5)
WBC: 4.8 K/uL (ref 4.0–10.5)
nRBC: 0 % (ref 0.0–0.2)

## 2020-07-28 LAB — SURGICAL PCR SCREEN
MRSA, PCR: POSITIVE — AB
Staphylococcus aureus: POSITIVE — AB

## 2020-07-28 NOTE — H&P (Signed)
TOTAL KNEE ADMISSION H&P  Patient is being admitted for right total knee arthroplasty.  Subjective:  Chief Complaint:  Right knee OA / pain  HPI: Jerry Mathews, 74 y.o. male, has a history of pain and functional disability in the right knee due to arthritis and has failed non-surgical conservative treatments for greater than 12 weeks to includeNSAID's and/or analgesics, corticosteriod injections and activity modification.  Onset of symptoms was gradual, starting 1-2 years ago with gradually worsening course since that time. The patient noted prior procedures on the knee to include  arthroplasty on the left knee(s).  Patient currently rates pain in the right knee(s) at 7 out of 10 with activity. Patient has night pain, worsening of pain with activity and weight bearing, pain that interferes with activities of daily living, pain with passive range of motion, crepitus and joint swelling.  Patient has evidence of periarticular osteophytes and joint space narrowing by imaging studies.  There is no active infection.  Risks, benefits and expectations were discussed with the patient.  Risks including but not limited to the risk of anesthesia, blood clots, nerve damage, blood vessel damage, failure of the prosthesis, infection and up to and including death.  Patient understand the risks, benefits and expectations and wishes to proceed with surgery.   PCP: Patient, No Pcp Per  D/C Plans:       Home   Post-op Meds:       Rx printed for ASA, Robaxin, Norco, Celebrex, Iron, Colace and MiraLax  Tranexamic Acid:      To be given - IV   Decadron:      Is to be given  FYI:      ASA  Norco   DME:   Pt already has equipment   PT:   OPPT   Pharmacy: [facility] - Prescriptions printed   Patient Active Problem List   Diagnosis Date Noted  . Obese 01/20/2014  . Left patella fracture 01/19/2014  . S/P left patella revision 01/06/2013  . HYPOTHYROIDISM 07/29/2010  . ASTHMA 07/29/2010  . HYPERTENSION 05/28/2007   . COPD 05/28/2007   Past Medical History:  Diagnosis Date  . Anxiety   . Arthritis   . Asthma   . Depression    PTSD-- MEDICATIONS HELP- AND HIS DOG HELPS; Antonito. HAS SOME MEMORY PROBLEMS WHICH PT AND HIS WIFE THINK MAY BE RELATED TO PTSD  . Diverticulosis   . History of colon polyps    ADENOMATOUS  . Hypertension   . Hypothyroidism   . Neuropathy of both feet   . OA (osteoarthritis) of knee    RIGHT  . OSA on CPAP   . Prostate cancer ([place])    [facility] [place]   . Right knee meniscal tear     Past Surgical History:  Procedure Laterality Date  . CHONDROPLASTY Right 08/21/2013   Procedure:  MEDIAL PETELLAR CHONDROPLASTY;  Surgeon: Mauri Pole, MD;  Location[facility];  Service: Orthopedics;  Laterality: Right;  . HAND SURGERY Left 2009  . KNEE ARTHROSCOPY Left 06-03-2004  &  07-12-2006   DEBRIDEMENT OF MENISCUS, PARTIAL ACL TEAR AND CHONDROMALACIA  . KNEE ARTHROSCOPY WITH LATERAL MENISECTOMY Right 08/21/2013   Procedure: KNEE ARTHROSCOPY WITH LATERAL MENISECTOMY;  Surgeon: Mauri Pole, MD;  [facility];  Service: Orthopedics;  Laterality: Right;  . KNEE ARTHROSCOPY WITH MEDIAL MENISECTOMY Right 08/21/2013   Procedure: KNEE ARTHROSCOPY WITH MEDIAL MENISECTOMY;  Surgeon: Mauri Pole, MD;  Location: [facility];  Service: Orthopedics;  Laterality: Right;  . MANDIBLE RECONSTRUCTION  1976  . ORIF PATELLA Left 01/19/2014   Procedure: LEFT KNEE PARTIAL PATTELECTOMY ;  Surgeon: Mauri Pole, MD;  Location: [facility];  Service: Orthopedics;  Laterality: Left;  . REVISION PATELLA OF LEFT TOTAL KNEE  05-01-2011  . TOTAL KNEE ARTHROPLASTY Left 11-12-2006   AND 02-05-2007  CLOSED MANIPULATION LEFT KNEE  . TOTAL KNEE REVISION Left 01/06/2013   Procedure: PATELLA COMPONENT REVISION / removal and replacement of patella component ;  Surgeon: Mauri Pole,  MD;  Location: [facility];  Service: Orthopedics;  Laterality: Left;  pt. also has a femoral nerve block in left leg.    No current facility-administered medications for this encounter.   Current Outpatient Medications  Medication Sig Dispense Refill Last Dose  . albuterol (PROVENTIL HFA;VENTOLIN HFA) 108 (90 BASE) MCG/ACT inhaler Inhale 2 puffs into the lungs every 6 (six) hours as needed for wheezing or shortness of breath.      Marland Kitchen azithromycin (AZASITE) 1 % ophthalmic solution Place 1 drop into both eyes daily.     . budesonide-formoterol (SYMBICORT) 160-4.5 MCG/ACT inhaler Inhale 2 puffs into the lungs 2 (two) times daily.     Marland Kitchen buPROPion (WELLBUTRIN XL) 150 MG 24 hr tablet Take 450 mg by mouth daily.     . carbidopa-levodopa (SINEMET IR) 10-100 MG tablet Take 1 tablet by mouth 3 (three) times daily.     . Carboxymethylcellulose Sod PF (REFRESH CELLUVISC) 1 % GEL Place 1 application into both eyes daily as needed (dry eyes).     . cetirizine (ZYRTEC) 10 MG tablet Take 10 mg by mouth daily.     Marland Kitchen doxycycline (VIBRAMYCIN) 50 MG capsule Take 50 mg by mouth every evening.     . fluticasone (FLONASE) 50 MCG/ACT nasal spray Place 1 spray into both nostrils daily.     Marland Kitchen gabapentin (NEURONTIN) 300 MG capsule Take 600 mg by mouth 2 (two) times daily. 600MG   AM/ 600 mg lunch   900MG  HS     . ipratropium (ATROVENT) 0.03 % nasal spray Place 1 spray into both nostrils every 12 (twelve) hours.     Marland Kitchen lamoTRIgine (LAMICTAL) 200 MG tablet Take 200 mg by mouth at bedtime.     Marland Kitchen levothyroxine (SYNTHROID) 137 MCG tablet Take 137 mcg by mouth daily before breakfast.     . Lifitegrast (XIIDRA) 5 % SOLN Place 1 drop into both eyes 2 (two) times daily.     . meloxicam (MOBIC) 15 MG tablet Take 15 mg by mouth daily.     . montelukast (SINGULAIR) 10 MG tablet Take 10 mg by mouth daily.     . tamsulosin (FLOMAX) 0.4 MG CAPS capsule Take 0.8 mg by mouth at bedtime.     . Tiotropium Bromide Monohydrate (SPIRIVA RESPIMAT)  2.5 MCG/ACT AERS Inhale 2 puffs into the lungs daily.      No Known Allergies  Social History   Tobacco Use  . Smoking status: Former Smoker    Packs/day: 1.00    Years: 25.00    Pack years: 25.00    Types: Cigarettes    Quit date: 12/31/1993    Years since quitting: 26.5  . Smokeless tobacco: Never Used  Substance Use Topics  . Alcohol use: Yes    Comment: occasional    Family History  Problem Relation Age of Onset  . Heart disease Mother   .  Colon cancer Neg Hx   . Esophageal cancer Neg Hx   . Rectal cancer Neg Hx   . Stomach cancer Neg Hx      Review of Systems  Constitutional: Negative.   HENT: Negative.   Eyes: Negative.   Respiratory: Negative.   Cardiovascular: Negative.   Gastrointestinal: Negative.   Genitourinary: Negative.   Musculoskeletal: Positive for joint pain.  Skin: Negative.   Neurological: Negative.   Endo/Heme/Allergies: Negative.   Psychiatric/Behavioral: Negative.      Objective:  Physical Exam Constitutional:      Appearance: He is well-developed.  HENT:     Head: Normocephalic.  Eyes:     Pupils: Pupils are equal, round, and reactive to light.  Neck:     Thyroid: No thyromegaly.     Vascular: No JVD.     Trachea: No tracheal deviation.  Cardiovascular:     Rate and Rhythm: Normal rate and regular rhythm.  Pulmonary:     Effort: Pulmonary effort is normal. No respiratory distress.     Breath sounds: Normal breath sounds. No wheezing.  Abdominal:     Palpations: Abdomen is soft.     Tenderness: There is no abdominal tenderness. There is no guarding.  Musculoskeletal:     Cervical back: Neck supple.     Right knee: Swelling and bony tenderness present. No erythema or ecchymosis. Decreased range of motion. Tenderness present.  Lymphadenopathy:     Cervical: No cervical adenopathy.  Skin:    General: Skin is warm and dry.  Neurological:     Mental Status: He is alert and oriented to person, place, and time.     Sensory:  Sensory deficit (neuropathy in bilateral LE) present.       Labs:  Estimated body mass index is 32.25 kg/m as calculated from the following:   Height as of 01/19/14: 6\' 5"  (1.956 m).   Weight as of 01/19/14: 123.4 kg.   Imaging Review Plain radiographs demonstrate  degenerative joint disease of the right knee.  The bone quality appears to be good for age and reported activity level.      Assessment/Plan:  End stage arthritis, right knee   The patient history, physical examination, clinical judgment of the provider and imaging studies are consistent with end stage degenerative joint disease of the right knee and total knee arthroplasty is deemed medically necessary. The treatment options including medical management, injection therapy arthroscopy and arthroplasty were discussed at length. The risks and benefits of total knee arthroplasty were presented and reviewed. The risks due to aseptic loosening, infection, stiffness, patella tracking problems, thromboembolic complications and other imponderables were discussed. The patient acknowledged the explanation, agreed to proceed with the plan and consent was signed. Patient is being admitted for treatment for surgery, pain control, PT, OT, prophylactic antibiotics, VTE prophylaxis, progressive ambulation and ADL's and discharge planning. The patient is planning to be discharged home with home health services     Patient's anticipated LOS is less than 2 midnights, meeting these requirements: - Lives within 1 hour of care - Has a competent adult at home to recover with post-op recover - NO history of  - Chronic pain requiring opiods  - Diabetes  - Coronary Artery Disease  - Heart failure  - Heart attack  - Stroke  - DVT/VTE  - Cardiac arrhythmia  - Respiratory Failure/COPD  - Renal failure  - Anemia  - Advanced Liver disease    [place]. Corey Laski   PA-C  07/28/2020,  8:37 AM

## 2020-07-28 NOTE — Progress Notes (Signed)
COVID Vaccine Completed:Yes Date COVID Vaccine completed:11/26/19 COVID vaccine manufacturer:    Moderna     [facility]P - [facility] - no  Chest x-ray - noEKG -  Stress Test - no ECHO - no Cardiac Cath - no Pacemaker/ICD device last checked:NA  Sleep Study - yes CPAP - yes  Fasting Blood Sugar - NA Checks Blood Sugar _____ times a day  Blood Thinner Instructions:NA Aspirin Instructions: Last Dose:  Anesthesia review:   Patient denies shortness of breath, fever, cough and chest pain at PAT appointment yes   Patient verbalized understanding of instructions that were given to them at the PAT appointment. Patient was also instructed that they will need to review over the PAT instructions again at home before surgery. Yes Pt reports that he has no SOB climbing stairs, working or with ADLs. He has PTSD and has a Neurosurgeon. He may need his wife with him pre op

## 2020-07-30 ENCOUNTER — Other Ambulatory Visit (HOSPITAL_COMMUNITY)
Admission: RE | Admit: 2020-07-30 | Discharge: 2020-07-30 | Disposition: A | Discharge status: Home / Self Care | Payer: No Typology Code available for payment source | Source: Ambulatory Visit | Attending: Orthopedic Surgery | Admitting: Orthopedic Surgery

## 2020-07-30 DIAGNOSIS — Z20822 Contact with and (suspected) exposure to covid-19: Secondary | ICD-10-CM | POA: Insufficient documentation

## 2020-07-30 DIAGNOSIS — Z01812 Encounter for preprocedural laboratory examination: Secondary | ICD-10-CM | POA: Insufficient documentation

## 2020-07-31 LAB — SARS CORONAVIRUS 2 (TAT 6-24 HRS): SARS Coronavirus 2: NEGATIVE

## 2020-08-02 MED ORDER — DEXTROSE 5 % IV SOLN
3.0000 g | INTRAVENOUS | Status: AC
  Administered 2020-08-03: 3 g via INTRAVENOUS
  Filled 2020-08-02: qty 3

## 2020-08-02 MED ORDER — VANCOMYCIN HCL 1500 MG/300ML IV SOLN
1500.0000 mg | INTRAVENOUS | Status: AC
  Administered 2020-08-03: 1500 mg via INTRAVENOUS
  Filled 2020-08-02: qty 300

## 2020-08-03 ENCOUNTER — Observation Stay (HOSPITAL_COMMUNITY)
Admission: RE | Admit: 2020-08-03 | Discharge: 2020-08-04 | Disposition: A | Discharge status: Home / Self Care | Payer: No Typology Code available for payment source | Source: Ambulatory Visit | Attending: Orthopedic Surgery | Admitting: Orthopedic Surgery

## 2020-08-03 ENCOUNTER — Ambulatory Visit (HOSPITAL_COMMUNITY): Payer: No Typology Code available for payment source | Admitting: Certified Registered Nurse Anesthetist

## 2020-08-03 ENCOUNTER — Encounter (HOSPITAL_COMMUNITY)
Admission: RE | Disposition: A | Discharge status: Home / Self Care | Payer: Self-pay | Source: Ambulatory Visit | Attending: Orthopedic Surgery

## 2020-08-03 ENCOUNTER — Other Ambulatory Visit: Payer: Self-pay

## 2020-08-03 ENCOUNTER — Encounter (HOSPITAL_COMMUNITY): Payer: Self-pay | Admitting: Orthopedic Surgery

## 2020-08-03 DIAGNOSIS — Z8546 Personal history of malignant neoplasm of prostate: Secondary | ICD-10-CM | POA: Diagnosis not present

## 2020-08-03 DIAGNOSIS — E669 Obesity, unspecified: Secondary | ICD-10-CM | POA: Diagnosis present

## 2020-08-03 DIAGNOSIS — Z96651 Presence of right artificial knee joint: Secondary | ICD-10-CM | POA: Diagnosis not present

## 2020-08-03 DIAGNOSIS — Z87891 Personal history of nicotine dependence: Secondary | ICD-10-CM | POA: Diagnosis not present

## 2020-08-03 DIAGNOSIS — Z79899 Other long term (current) drug therapy: Secondary | ICD-10-CM | POA: Insufficient documentation

## 2020-08-03 DIAGNOSIS — J45909 Unspecified asthma, uncomplicated: Secondary | ICD-10-CM | POA: Insufficient documentation

## 2020-08-03 DIAGNOSIS — M1711 Unilateral primary osteoarthritis, right knee: Secondary | ICD-10-CM | POA: Diagnosis not present

## 2020-08-03 DIAGNOSIS — E039 Hypothyroidism, unspecified: Secondary | ICD-10-CM | POA: Diagnosis not present

## 2020-08-03 DIAGNOSIS — M25561 Pain in right knee: Secondary | ICD-10-CM | POA: Diagnosis present

## 2020-08-03 DIAGNOSIS — I10 Essential (primary) hypertension: Secondary | ICD-10-CM | POA: Diagnosis not present

## 2020-08-03 DIAGNOSIS — J449 Chronic obstructive pulmonary disease, unspecified: Secondary | ICD-10-CM | POA: Diagnosis not present

## 2020-08-03 HISTORY — PX: TOTAL KNEE ARTHROPLASTY: SHX125

## 2020-08-03 LAB — TYPE AND SCREEN
ABO/RH(D): AB POS
Antibody Screen: NEGATIVE

## 2020-08-03 SURGERY — ARTHROPLASTY, KNEE, TOTAL
Anesthesia: Monitor Anesthesia Care | Site: Knee | Laterality: Right

## 2020-08-03 MED ORDER — HYDROCODONE-ACETAMINOPHEN 5-325 MG PO TABS
1.0000 | ORAL_TABLET | ORAL | Status: DC | PRN
  Administered 2020-08-03 – 2020-08-04 (×4): 2 via ORAL
  Filled 2020-08-03 (×4): qty 2

## 2020-08-03 MED ORDER — LAMOTRIGINE 100 MG PO TABS
200.0000 mg | ORAL_TABLET | Freq: Every day | ORAL | Status: DC
  Administered 2020-08-03: 200 mg via ORAL
  Filled 2020-08-03: qty 2

## 2020-08-03 MED ORDER — PHENYLEPHRINE 40 MCG/ML (10ML) SYRINGE FOR IV PUSH (FOR BLOOD PRESSURE SUPPORT)
PREFILLED_SYRINGE | INTRAVENOUS | Status: DC | PRN
  Administered 2020-08-03 (×2): 80 ug via INTRAVENOUS
  Administered 2020-08-03 (×2): 120 ug via INTRAVENOUS

## 2020-08-03 MED ORDER — METHOCARBAMOL 500 MG PO TABS
500.0000 mg | ORAL_TABLET | Freq: Four times a day (QID) | ORAL | Status: DC | PRN
  Administered 2020-08-04: 500 mg via ORAL
  Filled 2020-08-03: qty 1

## 2020-08-03 MED ORDER — ORAL CARE MOUTH RINSE: 15.0000 mL | Freq: Once | OROMUCOSAL | Status: AC

## 2020-08-03 MED ORDER — FLUTICASONE PROPIONATE 50 MCG/ACT NA SUSP
1.0000 | Freq: Every day | NASAL | Status: DC
  Filled 2020-08-03: qty 16

## 2020-08-03 MED ORDER — PHENOL 1.4 % MT LIQD: 1.0000 | OROMUCOSAL | Status: DC | PRN

## 2020-08-03 MED ORDER — MIDAZOLAM HCL 2 MG/2ML IJ SOLN
1.0000 mg | INTRAMUSCULAR | Status: DC
  Filled 2020-08-03: qty 2

## 2020-08-03 MED ORDER — BUPIVACAINE IN DEXTROSE 0.75-8.25 % IT SOLN
INTRATHECAL | Status: DC | PRN
  Administered 2020-08-03: 1.8 mL via INTRATHECAL

## 2020-08-03 MED ORDER — CLONIDINE HCL (ANALGESIA) 100 MCG/ML EP SOLN
EPIDURAL | Status: DC | PRN
  Administered 2020-08-03: 50 ug

## 2020-08-03 MED ORDER — ONDANSETRON HCL 4 MG/2ML IJ SOLN
INTRAMUSCULAR | Status: AC
  Filled 2020-08-03: qty 2

## 2020-08-03 MED ORDER — SODIUM CHLORIDE (PF) 0.9 % IJ SOLN
INTRAMUSCULAR | Status: DC | PRN
  Administered 2020-08-03: 30 mL

## 2020-08-03 MED ORDER — LIFITEGRAST 5 % OP SOLN: 1.0000 [drp] | Freq: Two times a day (BID) | OPHTHALMIC | Status: DC

## 2020-08-03 MED ORDER — ONDANSETRON HCL 4 MG/2ML IJ SOLN
INTRAMUSCULAR | Status: DC | PRN
  Administered 2020-08-03: 4 mg via INTRAVENOUS

## 2020-08-03 MED ORDER — LEVOTHYROXINE SODIUM 25 MCG PO TABS
137.0000 ug | ORAL_TABLET | Freq: Every day | ORAL | Status: DC
  Administered 2020-08-04: 137 ug via ORAL
  Filled 2020-08-03: qty 1

## 2020-08-03 MED ORDER — POLYVINYL ALCOHOL 1.4 % OP SOLN
1.0000 [drp] | Freq: Every day | OPHTHALMIC | Status: DC | PRN
  Filled 2020-08-03: qty 15

## 2020-08-03 MED ORDER — CELECOXIB 200 MG PO CAPS
200.0000 mg | ORAL_CAPSULE | Freq: Two times a day (BID) | ORAL | Status: DC
  Administered 2020-08-03 – 2020-08-04 (×2): 200 mg via ORAL
  Filled 2020-08-03 (×2): qty 1

## 2020-08-03 MED ORDER — PROPOFOL 500 MG/50ML IV EMUL
INTRAVENOUS | Status: DC | PRN
  Administered 2020-08-03: 75 ug/kg/min via INTRAVENOUS

## 2020-08-03 MED ORDER — SODIUM CHLORIDE 0.9 % IR SOLN
Status: DC | PRN
  Administered 2020-08-03: 1000 mL

## 2020-08-03 MED ORDER — HYDROCODONE-ACETAMINOPHEN 7.5-325 MG PO TABS: 1.0000 | ORAL_TABLET | ORAL | Status: DC | PRN

## 2020-08-03 MED ORDER — SODIUM CHLORIDE (PF) 0.9 % IJ SOLN
INTRAMUSCULAR | Status: AC
  Filled 2020-08-03: qty 50

## 2020-08-03 MED ORDER — ASPIRIN 81 MG PO CHEW
81.0000 mg | CHEWABLE_TABLET | Freq: Two times a day (BID) | ORAL | Status: DC
  Administered 2020-08-03 – 2020-08-04 (×2): 81 mg via ORAL
  Filled 2020-08-03 (×2): qty 1

## 2020-08-03 MED ORDER — ONDANSETRON HCL 4 MG/2ML IJ SOLN: 4.0000 mg | Freq: Four times a day (QID) | INTRAMUSCULAR | Status: DC | PRN

## 2020-08-03 MED ORDER — DOCUSATE SODIUM 100 MG PO CAPS
100.0000 mg | ORAL_CAPSULE | Freq: Two times a day (BID) | ORAL | Status: DC
  Administered 2020-08-03 – 2020-08-04 (×2): 100 mg via ORAL
  Filled 2020-08-03 (×2): qty 1

## 2020-08-03 MED ORDER — TRANEXAMIC ACID-NACL 1000-0.7 MG/100ML-% IV SOLN
1000.0000 mg | INTRAVENOUS | Status: AC
  Administered 2020-08-03: 1000 mg via INTRAVENOUS
  Filled 2020-08-03: qty 100

## 2020-08-03 MED ORDER — CELECOXIB 200 MG PO CAPS
200.0000 mg | ORAL_CAPSULE | Freq: Once | ORAL | Status: AC
  Administered 2020-08-03: 200 mg via ORAL
  Filled 2020-08-03: qty 1

## 2020-08-03 MED ORDER — 0.9 % SODIUM CHLORIDE (POUR BTL) OPTIME
TOPICAL | Status: DC | PRN
  Administered 2020-08-03: 1000 mL

## 2020-08-03 MED ORDER — STERILE WATER FOR IRRIGATION IR SOLN
Status: DC | PRN
  Administered 2020-08-03: 2000 mL

## 2020-08-03 MED ORDER — DEXAMETHASONE SODIUM PHOSPHATE 10 MG/ML IJ SOLN: 10.0000 mg | Freq: Once | INTRAMUSCULAR | Status: DC

## 2020-08-03 MED ORDER — CHLORHEXIDINE GLUCONATE 0.12 % MT SOLN
15.0000 mL | Freq: Once | OROMUCOSAL | Status: AC
  Administered 2020-08-03: 15 mL via OROMUCOSAL

## 2020-08-03 MED ORDER — PROPOFOL 10 MG/ML IV BOLUS
INTRAVENOUS | Status: DC | PRN
  Administered 2020-08-03: 30 mg via INTRAVENOUS
  Administered 2020-08-03 (×3): 20 mg via INTRAVENOUS

## 2020-08-03 MED ORDER — EPHEDRINE SULFATE-NACL 50-0.9 MG/10ML-% IV SOSY
PREFILLED_SYRINGE | INTRAVENOUS | Status: DC | PRN
  Administered 2020-08-03 (×3): 5 mg via INTRAVENOUS
  Administered 2020-08-03 (×2): 10 mg via INTRAVENOUS
  Administered 2020-08-03: 5 mg via INTRAVENOUS
  Administered 2020-08-03: 10 mg via INTRAVENOUS

## 2020-08-03 MED ORDER — PHENYLEPHRINE 40 MCG/ML (10ML) SYRINGE FOR IV PUSH (FOR BLOOD PRESSURE SUPPORT)
PREFILLED_SYRINGE | INTRAVENOUS | Status: AC
  Filled 2020-08-03: qty 10

## 2020-08-03 MED ORDER — GABAPENTIN 300 MG PO CAPS
600.0000 mg | ORAL_CAPSULE | Freq: Two times a day (BID) | ORAL | Status: DC
  Administered 2020-08-03 – 2020-08-04 (×2): 600 mg via ORAL
  Filled 2020-08-03 (×2): qty 2

## 2020-08-03 MED ORDER — ALUM & MAG HYDROXIDE-SIMETH 200-200-20 MG/5ML PO SUSP
15.0000 mL | ORAL | Status: DC | PRN
  Administered 2020-08-04: 15 mL via ORAL
  Filled 2020-08-03: qty 30

## 2020-08-03 MED ORDER — METHOCARBAMOL 500 MG IVPB - SIMPLE MED
500.0000 mg | Freq: Four times a day (QID) | INTRAVENOUS | Status: DC | PRN
  Filled 2020-08-03: qty 50

## 2020-08-03 MED ORDER — PROPOFOL 10 MG/ML IV BOLUS
INTRAVENOUS | Status: AC
  Filled 2020-08-03: qty 20

## 2020-08-03 MED ORDER — CEFAZOLIN SODIUM-DEXTROSE 2-4 GM/100ML-% IV SOLN
2.0000 g | Freq: Four times a day (QID) | INTRAVENOUS | Status: AC
  Administered 2020-08-03 – 2020-08-04 (×2): 2 g via INTRAVENOUS
  Filled 2020-08-03 (×2): qty 100

## 2020-08-03 MED ORDER — ROPIVACAINE HCL 5 MG/ML IJ SOLN
INTRAMUSCULAR | Status: DC | PRN
  Administered 2020-08-03: 25 mL via PERINEURAL

## 2020-08-03 MED ORDER — MENTHOL 3 MG MT LOZG: 1.0000 | LOZENGE | OROMUCOSAL | Status: DC | PRN

## 2020-08-03 MED ORDER — KETOROLAC TROMETHAMINE 30 MG/ML IJ SOLN
INTRAMUSCULAR | Status: DC | PRN
  Administered 2020-08-03: 30 mg

## 2020-08-03 MED ORDER — MOMETASONE FURO-FORMOTEROL FUM 200-5 MCG/ACT IN AERO
2.0000 | INHALATION_SPRAY | Freq: Two times a day (BID) | RESPIRATORY_TRACT | Status: DC
  Administered 2020-08-03 – 2020-08-04 (×2): 2 via RESPIRATORY_TRACT
  Filled 2020-08-03: qty 8.8

## 2020-08-03 MED ORDER — ACETAMINOPHEN 500 MG PO TABS
1000.0000 mg | ORAL_TABLET | Freq: Once | ORAL | Status: AC
  Administered 2020-08-03: 1000 mg via ORAL
  Filled 2020-08-03: qty 2

## 2020-08-03 MED ORDER — BUPIVACAINE HCL 0.25 % IJ SOLN
INTRAMUSCULAR | Status: AC
  Filled 2020-08-03: qty 1

## 2020-08-03 MED ORDER — TRANEXAMIC ACID-NACL 1000-0.7 MG/100ML-% IV SOLN
1000.0000 mg | Freq: Once | INTRAVENOUS | Status: AC
  Administered 2020-08-03: 1000 mg via INTRAVENOUS
  Filled 2020-08-03: qty 100

## 2020-08-03 MED ORDER — DEXAMETHASONE SODIUM PHOSPHATE 10 MG/ML IJ SOLN
10.0000 mg | Freq: Once | INTRAMUSCULAR | Status: AC
  Administered 2020-08-04: 10 mg via INTRAVENOUS
  Filled 2020-08-03: qty 1

## 2020-08-03 MED ORDER — FERROUS SULFATE 325 (65 FE) MG PO TABS
325.0000 mg | ORAL_TABLET | Freq: Two times a day (BID) | ORAL | Status: DC
  Administered 2020-08-04: 325 mg via ORAL
  Filled 2020-08-03: qty 1

## 2020-08-03 MED ORDER — KETOROLAC TROMETHAMINE 30 MG/ML IJ SOLN
INTRAMUSCULAR | Status: AC
  Filled 2020-08-03: qty 1

## 2020-08-03 MED ORDER — SODIUM CHLORIDE 0.9 % IV SOLN: INTRAVENOUS | Status: DC

## 2020-08-03 MED ORDER — DIPHENHYDRAMINE HCL 12.5 MG/5ML PO ELIX: 12.5000 mg | ORAL_SOLUTION | ORAL | Status: DC | PRN

## 2020-08-03 MED ORDER — BUPROPION HCL ER (XL) 300 MG PO TB24
450.0000 mg | ORAL_TABLET | Freq: Every day | ORAL | Status: DC
  Administered 2020-08-04: 450 mg via ORAL
  Filled 2020-08-03: qty 1

## 2020-08-03 MED ORDER — LORATADINE 10 MG PO TABS
10.0000 mg | ORAL_TABLET | Freq: Every day | ORAL | Status: DC
  Administered 2020-08-03 – 2020-08-04 (×2): 10 mg via ORAL
  Filled 2020-08-03 (×2): qty 1

## 2020-08-03 MED ORDER — METOCLOPRAMIDE HCL 5 MG PO TABS: 5.0000 mg | ORAL_TABLET | Freq: Three times a day (TID) | ORAL | Status: DC | PRN

## 2020-08-03 MED ORDER — POLYETHYLENE GLYCOL 3350 17 G PO PACK
17.0000 g | PACK | Freq: Two times a day (BID) | ORAL | Status: DC
  Administered 2020-08-03: 17 g via ORAL
  Filled 2020-08-03 (×2): qty 1

## 2020-08-03 MED ORDER — BUPIVACAINE HCL (PF) 0.25 % IJ SOLN
INTRAMUSCULAR | Status: DC | PRN
  Administered 2020-08-03: 30 mL

## 2020-08-03 MED ORDER — BISACODYL 10 MG RE SUPP: 10.0000 mg | Freq: Every day | RECTAL | Status: DC | PRN

## 2020-08-03 MED ORDER — ACETAMINOPHEN 325 MG PO TABS: 325.0000 mg | ORAL_TABLET | Freq: Four times a day (QID) | ORAL | Status: DC | PRN

## 2020-08-03 MED ORDER — DEXAMETHASONE SODIUM PHOSPHATE 10 MG/ML IJ SOLN
INTRAMUSCULAR | Status: DC | PRN
  Administered 2020-08-03: 10 mg via INTRAVENOUS

## 2020-08-03 MED ORDER — EPHEDRINE 5 MG/ML INJ
INTRAVENOUS | Status: AC
  Filled 2020-08-03: qty 10

## 2020-08-03 MED ORDER — AMISULPRIDE (ANTIEMETIC) 5 MG/2ML IV SOLN: 10.0000 mg | Freq: Once | INTRAVENOUS | Status: DC | PRN

## 2020-08-03 MED ORDER — MAGNESIUM CITRATE PO SOLN: 1.0000 | Freq: Once | ORAL | Status: DC | PRN

## 2020-08-03 MED ORDER — MONTELUKAST SODIUM 10 MG PO TABS
10.0000 mg | ORAL_TABLET | Freq: Every day | ORAL | Status: DC
  Administered 2020-08-03 – 2020-08-04 (×2): 10 mg via ORAL
  Filled 2020-08-03 (×2): qty 1

## 2020-08-03 MED ORDER — LIDOCAINE 2% (20 MG/ML) 5 ML SYRINGE
INTRAMUSCULAR | Status: AC
  Filled 2020-08-03: qty 5

## 2020-08-03 MED ORDER — ALBUTEROL SULFATE HFA 108 (90 BASE) MCG/ACT IN AERS: 2.0000 | INHALATION_SPRAY | Freq: Four times a day (QID) | RESPIRATORY_TRACT | Status: DC | PRN

## 2020-08-03 MED ORDER — MORPHINE SULFATE (PF) 2 MG/ML IV SOLN: 0.5000 mg | INTRAVENOUS | Status: DC | PRN

## 2020-08-03 MED ORDER — TAMSULOSIN HCL 0.4 MG PO CAPS
0.8000 mg | ORAL_CAPSULE | Freq: Every day | ORAL | Status: DC
  Administered 2020-08-03: 0.8 mg via ORAL
  Filled 2020-08-03: qty 2

## 2020-08-03 MED ORDER — DEXAMETHASONE SODIUM PHOSPHATE 10 MG/ML IJ SOLN
INTRAMUSCULAR | Status: AC
  Filled 2020-08-03: qty 1

## 2020-08-03 MED ORDER — FENTANYL CITRATE (PF) 100 MCG/2ML IJ SOLN: 25.0000 ug | INTRAMUSCULAR | Status: DC | PRN

## 2020-08-03 MED ORDER — POVIDONE-IODINE 10 % EX SWAB
2.0000 "application " | Freq: Once | CUTANEOUS | Status: AC
  Administered 2020-08-03: 2 via TOPICAL

## 2020-08-03 MED ORDER — IPRATROPIUM BROMIDE 0.03 % NA SOLN
1.0000 | Freq: Two times a day (BID) | NASAL | Status: DC
  Filled 2020-08-03: qty 30

## 2020-08-03 MED ORDER — FENTANYL CITRATE (PF) 100 MCG/2ML IJ SOLN
50.0000 ug | INTRAMUSCULAR | Status: DC
  Administered 2020-08-03: 50 ug via INTRAVENOUS
  Filled 2020-08-03: qty 2

## 2020-08-03 MED ORDER — ONDANSETRON HCL 4 MG PO TABS: 4.0000 mg | ORAL_TABLET | Freq: Four times a day (QID) | ORAL | Status: DC | PRN

## 2020-08-03 MED ORDER — DOXYCYCLINE HYCLATE 100 MG PO TABS
50.0000 mg | ORAL_TABLET | Freq: Every evening | ORAL | Status: DC
  Administered 2020-08-03: 50 mg via ORAL
  Filled 2020-08-03: qty 0.5

## 2020-08-03 MED ORDER — CARBIDOPA-LEVODOPA 10-100 MG PO TABS
1.0000 | ORAL_TABLET | Freq: Three times a day (TID) | ORAL | Status: DC
  Administered 2020-08-03 – 2020-08-04 (×3): 1 via ORAL
  Filled 2020-08-03 (×4): qty 1

## 2020-08-03 MED ORDER — AZITHROMYCIN 1 % OP SOLN: 1.0000 [drp] | Freq: Every day | OPHTHALMIC | Status: DC

## 2020-08-03 MED ORDER — UMECLIDINIUM BROMIDE 62.5 MCG/INH IN AEPB
1.0000 | INHALATION_SPRAY | Freq: Every day | RESPIRATORY_TRACT | Status: DC
  Administered 2020-08-04: 08:00:00 1 via RESPIRATORY_TRACT
  Filled 2020-08-03: qty 7

## 2020-08-03 MED ORDER — METOCLOPRAMIDE HCL 5 MG/ML IJ SOLN: 5.0000 mg | Freq: Three times a day (TID) | INTRAMUSCULAR | Status: DC | PRN

## 2020-08-03 MED ORDER — LACTATED RINGERS IV SOLN: INTRAVENOUS | Status: DC

## 2020-08-03 MED ORDER — TIOTROPIUM BROMIDE MONOHYDRATE 2.5 MCG/ACT IN AERS: 2.0000 | INHALATION_SPRAY | Freq: Every day | RESPIRATORY_TRACT | Status: DC

## 2020-08-03 SURGICAL SUPPLY — 61 items
ADH SKN CLS APL DERMABOND .7 (GAUZE/BANDAGES/DRESSINGS) ×1
ATTUNE MED ANAT PAT 41 KNEE (Knees) ×2 IMPLANT
ATTUNE PS FEM RT SZ 8 CEM KNEE (Femur) ×2 IMPLANT
ATTUNE PSRP INSR SZ8 6 KNEE (Insert) ×2 IMPLANT
BAG SPEC THK2 15X12 ZIP CLS (MISCELLANEOUS)
BAG ZIPLOCK 12X15 (MISCELLANEOUS) IMPLANT
BASE TIBIAL ATTUNE KNEE SZ9 (Knees) ×1 IMPLANT
BLADE SAW SGTL 11.0X1.19X90.0M (BLADE) IMPLANT
BLADE SAW SGTL 13.0X1.19X90.0M (BLADE) ×2 IMPLANT
BLADE SURG SZ10 CARB STEEL (BLADE) ×4 IMPLANT
BNDG ELASTIC 6X5.8 VLCR STR LF (GAUZE/BANDAGES/DRESSINGS) ×2 IMPLANT
BOWL SMART MIX CTS (DISPOSABLE) ×2 IMPLANT
CEMENT HV SMART SET (Cement) ×4 IMPLANT
COVER SURGICAL LIGHT HANDLE (MISCELLANEOUS) ×2 IMPLANT
COVER WAND RF STERILE (DRAPES) ×2 IMPLANT
CUFF TOURN SGL QUICK 34 (TOURNIQUET CUFF) ×2
CUFF TRNQT CYL 34X4.125X (TOURNIQUET CUFF) ×1 IMPLANT
DECANTER SPIKE VIAL GLASS SM (MISCELLANEOUS) ×4 IMPLANT
DERMABOND ADVANCED (GAUZE/BANDAGES/DRESSINGS) ×1
DERMABOND ADVANCED .7 DNX12 (GAUZE/BANDAGES/DRESSINGS) ×1 IMPLANT
DRAPE U-SHAPE 47X51 STRL (DRAPES) ×2 IMPLANT
DRESSING AQUACEL AG SP 3.5X10 (GAUZE/BANDAGES/DRESSINGS) ×1 IMPLANT
DRSG AQUACEL AG SP 3.5X10 (GAUZE/BANDAGES/DRESSINGS) ×2
DURAPREP 26ML APPLICATOR (WOUND CARE) ×4 IMPLANT
ELECT REM PT RETURN 15FT ADLT (MISCELLANEOUS) ×2 IMPLANT
GLOVE BIO SURGEON STRL SZ 6 (GLOVE) ×2 IMPLANT
GLOVE BIOGEL PI IND STRL 6.5 (GLOVE) ×1 IMPLANT
GLOVE BIOGEL PI IND STRL 7.5 (GLOVE) ×1 IMPLANT
GLOVE BIOGEL PI IND STRL 8.5 (GLOVE) ×1 IMPLANT
GLOVE BIOGEL PI INDICATOR 6.5 (GLOVE) ×1
GLOVE BIOGEL PI INDICATOR 7.5 (GLOVE) ×1
GLOVE BIOGEL PI INDICATOR 8.5 (GLOVE) ×1
GLOVE ECLIPSE 8.0 STRL XLNG CF (GLOVE) ×2 IMPLANT
GLOVE ORTHO TXT STRL SZ7.5 (GLOVE) ×2 IMPLANT
GOWN STRL REUS W/ TWL LRG LVL3 (GOWN DISPOSABLE) ×1 IMPLANT
GOWN STRL REUS W/TWL 2XL LVL3 (GOWN DISPOSABLE) ×2 IMPLANT
GOWN STRL REUS W/TWL LRG LVL3 (GOWN DISPOSABLE) ×4 IMPLANT
HANDPIECE INTERPULSE COAX TIP (DISPOSABLE) ×2
HOLDER FOLEY CATH W/STRAP (MISCELLANEOUS) IMPLANT
KIT TURNOVER KIT A (KITS) IMPLANT
MANIFOLD NEPTUNE II (INSTRUMENTS) ×2 IMPLANT
NDL SAFETY ECLIPSE 18X1.5 (NEEDLE) IMPLANT
NEEDLE HYPO 18GX1.5 SHARP (NEEDLE)
NS IRRIG 1000ML POUR BTL (IV SOLUTION) ×2 IMPLANT
PACK TOTAL KNEE CUSTOM (KITS) ×2 IMPLANT
PENCIL SMOKE EVACUATOR (MISCELLANEOUS) ×2 IMPLANT
PIN DRILL FIX HALF THREAD (BIT) ×2 IMPLANT
PIN FIX SIGMA LCS THRD HI (PIN) ×2 IMPLANT
PROTECTOR NERVE ULNAR (MISCELLANEOUS) ×2 IMPLANT
SET HNDPC FAN SPRY TIP SCT (DISPOSABLE) ×1 IMPLANT
SET PAD KNEE POSITIONER (MISCELLANEOUS) ×2 IMPLANT
SUT MNCRL AB 4-0 PS2 18 (SUTURE) ×2 IMPLANT
SUT STRATAFIX PDS+ 0 24IN (SUTURE) ×2 IMPLANT
SUT VIC AB 1 CT1 36 (SUTURE) ×2 IMPLANT
SUT VIC AB 2-0 CT1 27 (SUTURE) ×6
SUT VIC AB 2-0 CT1 TAPERPNT 27 (SUTURE) ×3 IMPLANT
SYR 3ML LL SCALE MARK (SYRINGE) ×2 IMPLANT
TIBIAL BASE ATTUNE KNEE SZ9 (Knees) ×2 IMPLANT
TRAY FOLEY MTR SLVR 16FR STAT (SET/KITS/TRAYS/PACK) ×2 IMPLANT
WATER STERILE IRR 1000ML POUR (IV SOLUTION) ×4 IMPLANT
WRAP KNEE MAXI GEL POST OP (GAUZE/BANDAGES/DRESSINGS) ×2 IMPLANT

## 2020-08-03 NOTE — Anesthesia Procedure Notes (Signed)
Anesthesia Regional Block: Adductor canal block   Pre-Anesthetic Checklist: ,, timeout performed, Correct Patient, Correct Site, Correct Laterality, Correct Procedure, Correct Position, site marked, Risks and benefits discussed,  Surgical consent,  Pre-op evaluation,  At surgeon's request and post-op pain management  Laterality: Right  Prep: chloraprep       Needles:  Injection technique: Single-shot  Needle Type: Echogenic Needle     Needle Length: 9cm  Needle Gauge: 21     Additional Needles:   Procedures:,,,, ultrasound used (permanent image in chart),,,,  Narrative:  Start time: 08/03/2020 1:15 PM End time: 08/03/2020 1:21 PM Injection made incrementally with aspirations every 5 mL.  Performed by: Personally  Anesthesiologist: Suzette Battiest, MD

## 2020-08-03 NOTE — Op Note (Signed)
NAME:  Jerry Mathews                      MEDICAL RECORD NO.:  [id number]                             FACILITY:  [facility]      PHYSICIAN:  Pietro Cassis. Alvan Dame, M.D.  DATE OF BIRTH:  1946-06-13      DATE OF PROCEDURE:  08/03/2020                                     OPERATIVE REPORT         PREOPERATIVE DIAGNOSIS:  Right knee osteoarthritis.      POSTOPERATIVE DIAGNOSIS:  Right knee osteoarthritis.      FINDINGS:  The patient was noted to have complete loss of cartilage and   bone-on-bone arthritis with associated osteophytes in the medial and patellofemoral compartments of   the knee with a significant synovitis and associated effusion.  The patient had failed months of conservative treatment including medications, injection therapy, activity modification.     PROCEDURE:  Right total knee replacement.      COMPONENTS USED:  DePuy Attune rotating platform posterior stabilized knee   system, a size 8 femur, 9 tibia, size size 6 mm PS AOX insert, and 41 anatomic patellar   button.      SURGEON:  Pietro Cassis. Alvan Dame, M.D.      ASSISTANT:  Danae Orleans, PA-C.      ANESTHESIA:  Regional and Spinal.      SPECIMENS:  None.      COMPLICATION:  None.      DRAINS:  None.  EBL: <100 cc      TOURNIQUET TIME:  37 min at 200 mmHg .      The patient was stable to the recovery room.      INDICATION FOR PROCEDURE:  Jerry Mathews is a 74 y.o. male patient of   mine.  The patient had been seen, evaluated, and treated for months conservatively in the   office with medication, activity modification, and injections.  The patient had   radiographic changes of bone-on-bone arthritis with endplate sclerosis and osteophytes noted.  Based on the radiographic changes and failed conservative measures, the patient   decided to proceed with definitive treatment, total knee replacement.  Risks of infection, DVT, component failure, need for revision surgery, neurovascular injury were reviewed in the office  setting.  The postop course was reviewed stressing the efforts to maximize post-operative satisfaction and function.  Consent was obtained for benefit of pain   relief.      PROCEDURE IN DETAIL:  The patient was brought to the operative theater.   Once adequate anesthesia, preoperative antibiotics, 2 gm of Ancef,1 gm of Tranexamic Acid, and 10 mg of Decadron administered, the patient was positioned supine with a right thigh tourniquet placed.  The  right lower extremity was prepped and draped in sterile fashion.  A time-   out was performed identifying the patient, planned procedure, and the appropriate extremity.      The right lower extremity was placed in the [facility] leg holder.  The leg was   exsanguinated, tourniquet elevated to 250 mmHg.  A midline incision was   made followed by median parapatellar arthrotomy.  Following initial  exposure, attention was first directed to the patella.  Precut   measurement was noted to be 26 mm.  I resected down to 14-15 mm and used a   41 anatomic patellar button to restore patellar height as well as cover the cut surface.      The lug holes were drilled and a metal shim was placed to protect the   patella from retractors and saw blade during the procedure.      At this point, attention was now directed to the femur.  The femoral   canal was opened with a drill, irrigated to try to prevent fat emboli.  An   intramedullary rod was passed at 5 degrees valgus, 9 mm of bone was   resected off the distal femur.  Following this resection, the tibia was   subluxated anteriorly.  Using the extramedullary guide, 2 mm of bone was resected off   the proximal medial tibia.  We confirmed the gap would be   stable medially and laterally with a size 5 spacer block as well as confirmed that the tibial cut was perpendicular in the coronal plane, checking with an alignment rod.      Once this was done, I sized the femur to be a size 8 in the anterior-   posterior  dimension, chose a standard component based on medial and   lateral dimension.  The size 8 rotation block was then pinned in   position anterior referenced using the C-clamp to set rotation.  The   anterior, posterior, and  chamfer cuts were made without difficulty nor   notching making certain that I was along the anterior cortex to help   with flexion gap stability.      The final box cut was made off the lateral aspect of distal femur.      At this point, the tibia was sized to be a size 9.  The size 9 tray was   then pinned in position through the medial third of the tubercle,   drilled, and keel punched.  Trial reduction was now carried with a 8 femur,  9 tibia, a size 6 mm PS insert, and the 41 anatomic patella botton.  The knee was brought to full extension with good flexion stability with the patella   tracking through the trochlea without application of pressure.  Given   all these findings the trial components removed.  Final components were   opened and cement was mixed.  The knee was irrigated with normal saline solution and pulse lavage.  The synovial lining was   then injected with 30 cc of 0.25% Marcaine with epinephrine, 1 cc of Toradol and 30 cc of NS for a total of 61 cc.     Final implants were then cemented onto cleaned and dried cut surfaces of bone with the knee brought to extension with a size 6 mm PS trial insert.      Once the cement had fully cured, excess cement was removed   throughout the knee.  I confirmed that I was satisfied with the range of   motion and stability, and the final size 6 mm PS AOX insert was chosen.  It was   placed into the knee.      The tourniquet had been let down at 37 minutes.  No significant   hemostasis was required.  The extensor mechanism was then reapproximated using #1 Vicryl and #1 Stratafix sutures with the knee   in flexion.  The   remaining wound was closed with 2-0 Vicryl and running 4-0 Monocryl.   The knee was cleaned,  dried, dressed sterilely using Dermabond and   Aquacel dressing.  The patient was then   brought to recovery room in stable condition, tolerating the procedure   well.   Please note that Physician Assistant, Danae Orleans, PA-C was present for the entirety of the case, and was utilized for pre-operative positioning, peri-operative retractor management, general facilitation of the procedure and for primary wound closure at the end of the case.              Pietro Cassis Alvan Dame, M.D.    08/03/2020 2:26 PM

## 2020-08-03 NOTE — Progress Notes (Signed)
AssistedDr. Rob Fitzgerald with right, ultrasound guided, adductor canal block. Side rails up, monitors on throughout procedure. See vital signs in flow sheet. Tolerated Procedure well.  

## 2020-08-03 NOTE — Anesthesia Preprocedure Evaluation (Signed)
Anesthesia Evaluation  Patient identified by MRN, date of birth, ID band Patient awake    Reviewed: Allergy & Precautions, NPO status , Patient's Chart, lab work & pertinent test results  Airway Mallampati: II  TM Distance: >3 FB Neck ROM: Full    Dental  (+) Dental Advisory Given   Pulmonary asthma , sleep apnea , COPD, former smoker,    breath sounds clear to auscultation       Cardiovascular hypertension, Pt. on medications  Rhythm:Regular Rate:Normal     Neuro/Psych  Neuromuscular disease    GI/Hepatic negative GI ROS, Neg liver ROS,   Endo/Other  Hypothyroidism   Renal/GU negative Renal ROS     Musculoskeletal  (+) Arthritis ,   Abdominal   Peds  Hematology negative hematology ROS (+)   Anesthesia Other Findings   Reproductive/Obstetrics                             Lab Results  Component Value Date   WBC 4.8 07/28/2020   HGB 15.6 07/28/2020   HCT 46.2 07/28/2020   MCV 92.0 07/28/2020   PLT 219 07/28/2020   Lab Results  Component Value Date   CREATININE 0.83 07/28/2020   BUN 17 07/28/2020   NA 138 07/28/2020   K 4.3 07/28/2020   CL 103 07/28/2020   CO2 27 07/28/2020    Anesthesia Physical Anesthesia Plan  ASA: III  Anesthesia Plan: MAC and Spinal   Post-op Pain Management:  Regional for Post-op pain   Induction:   PONV Risk Score and Plan: 1 and Propofol infusion, Ondansetron and Treatment may vary due to age or medical condition  Airway Management Planned: Natural Airway and Simple Face Mask  Additional Equipment: None  Intra-op Plan:   Post-operative Plan:   Informed Consent: I have reviewed the patients History and Physical, chart, labs and discussed the procedure including the risks, benefits and alternatives for the proposed anesthesia with the patient or authorized representative who has indicated his/her understanding and acceptance.       Plan  Discussed with: CRNA  Anesthesia Plan Comments:         Anesthesia Quick Evaluation

## 2020-08-03 NOTE — Anesthesia Procedure Notes (Addendum)
Spinal  Patient location during procedure: OR Start time: 08/03/2020 2:07 PM End time: 08/03/2020 2:13 PM Staffing Performed: anesthesiologist  Anesthesiologist: Suzette Battiest, MD Preanesthetic Checklist Completed: patient identified, IV checked, site marked, risks and benefits discussed, surgical consent, monitors and equipment checked, pre-op evaluation and timeout performed Spinal Block Patient position: sitting Prep: DuraPrep Patient monitoring: heart rate, cardiac monitor, continuous pulse ox and blood pressure Approach: midline Location: L3-4 Injection technique: single-shot Needle Needle type: Sprotte  Needle gauge: 24 G Needle length: 9 cm Assessment Sensory level: T4 Additional Notes Aspiration before injection. Unable to aspirate after injection LA. Free flowing CSF upon puncture of dura.

## 2020-08-03 NOTE — Transfer of Care (Signed)
Immediate Anesthesia Transfer of Care Note  Patient: Jerry Mathews  Procedure(s) Performed: TOTAL KNEE ARTHROPLASTY (Right Knee)  Patient Location: PACU  Anesthesia Type:Spinal and MAC combined with regional for post-op pain  Level of Consciousness: awake, alert  and oriented  Airway & Oxygen Therapy: Patient Spontanous Breathing and Patient connected to face mask oxygen  Post-op Assessment: Report given to RN and Post -op Vital signs reviewed and stable  Post vital signs: Reviewed and stable  Last Vitals:  Vitals Value Taken Time  BP 121/90 08/03/20 1611  Temp    Pulse 63 08/03/20 1613  Resp 22 08/03/20 1613  SpO2 98 % 08/03/20 1613  Vitals shown include unvalidated device data.  Last Pain:  Vitals:   08/03/20 1216  TempSrc: Oral      Patients Stated Pain Goal: 4 (75/64/33 2951)  Complications: No complications documented.

## 2020-08-03 NOTE — Interval H&P Note (Signed)
History and Physical Interval Note:  08/03/2020 1:06 PM  Jerry Mathews  has presented today for surgery, with the diagnosis of Right knee osteoarthritis.  The various methods of treatment have been discussed with the patient and family. After consideration of risks, benefits and other options for treatment, the patient has consented to  Procedure(s) with comments: TOTAL KNEE ARTHROPLASTY (Right) - 70 mins as a surgical intervention.  The patient's history has been reviewed, patient examined, no change in status, stable for surgery.  I have reviewed the patient's chart and labs.  Questions were answered to the patient's satisfaction.     Mauri Pole

## 2020-08-03 NOTE — Anesthesia Postprocedure Evaluation (Signed)
Anesthesia Post Note  Patient: Jerry Mathews  Procedure(s) Performed: TOTAL KNEE ARTHROPLASTY (Right Knee)     Patient location during evaluation: PACU Anesthesia Type: Spinal Level of consciousness: awake and alert Pain management: pain level controlled Vital Signs Assessment: post-procedure vital signs reviewed and stable Respiratory status: spontaneous breathing and respiratory function stable Cardiovascular status: blood pressure returned to baseline and stable Postop Assessment: spinal receding Anesthetic complications: no   No complications documented.  Last Vitals:  Vitals:   08/03/20 1700 08/03/20 1714  BP: (!) 145/85 (!) 141/83  Pulse: (!) 54 (!) 57  Resp: 18 17  Temp:  36.5 C  SpO2: 98% 97%    Last Pain:  Vitals:   08/03/20 1714  TempSrc: Oral  PainSc:                  Tiajuana Amass

## 2020-08-03 NOTE — Discharge Instructions (Signed)

## 2020-08-03 NOTE — Progress Notes (Signed)
Pt declined use of cpap tonight, prefers nasal cannula instead.  HR64, rr18. spo2 94%.  Pt was encouraged to call should he change his mind.  RN aware.

## 2020-08-03 NOTE — Progress Notes (Signed)
Vancomycin was started at 1325, around 1335 pt stated he felt hot and flushed. BP had decreased to 96/63. Vancomycin was stopped at this time. Pt denies any chest pain, shortness of breath, or changes in breathing. Vanc was restarted at 1345 at a slower rate (see MAR). BP presently 111/72. Pt alert and oriented.

## 2020-08-04 ENCOUNTER — Encounter (HOSPITAL_COMMUNITY): Payer: Self-pay | Admitting: Orthopedic Surgery

## 2020-08-04 DIAGNOSIS — M1711 Unilateral primary osteoarthritis, right knee: Secondary | ICD-10-CM | POA: Diagnosis not present

## 2020-08-04 LAB — CBC
HCT: 39.4 % (ref 39.0–52.0)
Hemoglobin: 13.3 g/dL (ref 13.0–17.0)
MCH: 31.1 pg (ref 26.0–34.0)
MCHC: 33.8 g/dL (ref 30.0–36.0)
MCV: 92.1 fL (ref 80.0–100.0)
Platelets: 189 10*3/uL (ref 150–400)
RBC: 4.28 MIL/uL (ref 4.22–5.81)
RDW: 13.1 % (ref 11.5–15.5)
WBC: 9.9 10*3/uL (ref 4.0–10.5)
nRBC: 0 % (ref 0.0–0.2)

## 2020-08-04 LAB — BASIC METABOLIC PANEL
Anion gap: 7 (ref 5–15)
BUN: 18 mg/dL (ref 8–23)
CO2: 25 mmol/L (ref 22–32)
Calcium: 8.8 mg/dL — ABNORMAL LOW (ref 8.9–10.3)
Chloride: 105 mmol/L (ref 98–111)
Creatinine, Ser: 0.81 mg/dL (ref 0.61–1.24)
GFR, Estimated: >60 mL/min (ref >60)
Glucose, Bld: 144 mg/dL — ABNORMAL HIGH (ref 70–99)
Potassium: 4.3 mmol/L (ref 3.5–5.1)
Sodium: 137 mmol/L (ref 135–145)

## 2020-08-04 MED ORDER — METHOCARBAMOL 500 MG PO TABS: 500.0000 mg | ORAL_TABLET | Freq: Four times a day (QID) | ORAL | 0 refills | Status: DC | PRN

## 2020-08-04 MED ORDER — DOCUSATE SODIUM 100 MG PO CAPS: 100.0000 mg | ORAL_CAPSULE | Freq: Two times a day (BID) | ORAL | 0 refills | Status: DC

## 2020-08-04 MED ORDER — ASPIRIN 81 MG PO CHEW: 81.0000 mg | CHEWABLE_TABLET | Freq: Two times a day (BID) | ORAL | 0 refills | Status: AC

## 2020-08-04 MED ORDER — POLYETHYLENE GLYCOL 3350 17 G PO PACK: 17.0000 g | PACK | Freq: Two times a day (BID) | ORAL | 0 refills | Status: DC

## 2020-08-04 MED ORDER — FERROUS SULFATE 325 (65 FE) MG PO TABS: 325.0000 mg | ORAL_TABLET | Freq: Three times a day (TID) | ORAL | 0 refills | Status: DC

## 2020-08-04 MED ORDER — HYDROCODONE-ACETAMINOPHEN 7.5-325 MG PO TABS
1.0000 | ORAL_TABLET | ORAL | 0 refills | Status: DC | PRN
Start: 2020-08-04 — End: 2023-01-24

## 2020-08-04 NOTE — TOC Transition Note (Addendum)
Transition of Care [facility]) - CM/SW Discharge Note   Patient Details  Name: Jerry Mathews MRN: [id number] Date of Birth: 1946/01/27  Transition of Care [facility]) CM/SW Contact:  Lia Hopping, [place] Phone Number: 08/04/2020, 11:35 AM   Clinical Narrative:    Therapy Plan: OPPT Patient has a RW. Declines 3 in 1.   CSW received a call from [facility] community homecare case manager Alen Bleacher [phone]. CSW notified per the patient he is scheduled to do OPPT-[place]. No other needs identified.    Final next level of care: OP Rehab     Patient Goals and CMS Choice        Discharge Placement                       Discharge Plan and Services                                     Social Determinants of Health (SDOH) Interventions     Readmission Risk Interventions No flowsheet data found.

## 2020-08-04 NOTE — Discharge Summary (Signed)
Patient ID: HARTFORD MAULDEN MRN: [id number] DOB/AGE: [birth year] 74 y.o.  Admit date: 08/03/2020 Discharge date: 08/04/2020  Admission Diagnoses:  Principal Problem:   Right knee OA Active Problems:   Obese   S/P right TKA   Discharge Diagnoses:  Same  Past Medical History:  Diagnosis Date  . Anxiety   . Arthritis   . Asthma   . Depression    PTSD-- MEDICATIONS HELP- AND HIS DOG HELPS; [place]. HAS SOME MEMORY PROBLEMS WHICH PT AND HIS WIFE THINK MAY BE RELATED TO PTSD  . Diverticulosis   . History of colon polyps    ADENOMATOUS  . Hypertension   . Hypothyroidism   . Neuropathy of both feet   . OA (osteoarthritis) of knee    RIGHT  . OSA on CPAP   . Prostate cancer ([place])    [facility] [place]   . PTSD (post-traumatic stress disorder)   . Right knee meniscal tear     Surgeries: Procedure(s): RIGHT TOTAL KNEE ARTHROPLASTY on 08/03/2020   Consultants: N/A  Discharged Condition: Improved  Hospital Course: LAMEL MCCARLEY is an 74 y.o. male who was admitted 08/03/2020 for operative treatment ofOsteoarthritis of right knee. Patient has severe unremitting pain that affects sleep, daily activities, and work/hobbies. After pre-op clearance the patient was taken to the operating room on 08/03/2020 and underwent  Procedure(s): RIGHT TOTAL KNEE ARTHROPLASTY.    Patient was given perioperative antibiotics:  Anti-infectives (From admission, onward)   Start     Dose/Rate Route Frequency Ordered Stop   08/03/20 2200  doxycycline (VIBRA-TABS) tablet 50 mg        50 mg Oral Every evening 08/03/20 1711     08/03/20 2000  ceFAZolin (ANCEF) IVPB 2g/100 mL premix        2 g 200 mL/hr over 30 Minutes Intravenous Every 6 hours 08/03/20 1711 08/04/20 0147   08/03/20 0600  ceFAZolin (ANCEF) 3 g in dextrose 5 % 50 mL IVPB        3 g 100 mL/hr over 30 Minutes Intravenous On call to O.R. 08/02/20 0800 08/03/20 1447   08/03/20  0600  vancomycin (VANCOREADY) IVPB 1500 mg/300 mL        1,500 mg 150 mL/hr over 120 Minutes Intravenous 90 min pre-op 08/02/20 0800 08/03/20 1525       Patient was given sequential compression devices, early ambulation, and chemoprophylaxis to prevent DVT.  Patient benefited maximally from hospital stay and there were no complications.    Recent vital signs:  Patient Vitals for the past 24 hrs:  BP Temp Temp src Pulse Resp SpO2 Height Weight  08/04/20 0557 124/79 97.6 F (36.4 C) Oral 60 17 97 % -- --  08/04/20 0115 116/69 98.1 F (36.7 C) Oral 62 17 93 % -- --  08/03/20 2049 (!) 149/84 (!) 97.4 F (36.3 C) Oral 66 17 93 % -- --  08/03/20 1950 -- -- -- -- -- 94 % -- --  08/03/20 1943 132/80 97.6 F (36.4 C) Oral 62 17 95 % -- --  08/03/20 1810 (!) 149/85 97.6 F (36.4 C) Oral 64 14 97 % -- --  08/03/20 1714 (!) 141/83 97.7 F (36.5 C) Oral (!) 57 17 97 % -- --  08/03/20 1700 (!) 145/85 -- -- (!) 54 18 98 % -- --  08/03/20 1645 129/87 -- -- (!) 57 14 100 % -- --  08/03/20 1630 126/76 -- -- 63  11 97 % -- --  08/03/20 1615 (!) 107/52 97.8 F (36.6 C) -- 62 12 97 % -- --  08/03/20 1327 -- -- -- (!) 58 15 95 % -- --  08/03/20 1326 122/89 -- -- 60 13 94 % -- --  08/03/20 1325 -- -- -- (!) 59 16 95 % -- --  08/03/20 1324 -- -- -- 61 15 95 % -- --  08/03/20 1323 -- -- -- 62 18 95 % -- --  08/03/20 1322 -- -- -- 63 16 95 % -- --  08/03/20 1321 -- -- -- 64 15 95 % -- --  08/03/20 1320 (!) 168/95 -- -- 65 14 95 % -- --  08/03/20 1319 -- -- -- 64 15 96 % -- --  08/03/20 1318 -- -- -- 65 16 95 % -- --  08/03/20 1317 -- -- -- 64 17 96 % -- --  08/03/20 1316 (!) 157/103 -- -- 68 20 95 % -- --  08/03/20 1315 -- -- -- 66 14 95 % -- --  08/03/20 1314 -- -- -- 64 17 95 % -- --  08/03/20 1313 -- -- -- 64 16 95 % -- --  08/03/20 1312 (!) 173/97 -- -- 65 19 94 % -- --  08/03/20 1311 (!) 162/102 -- -- 65 20 93 % -- --  08/03/20 1310 -- -- -- 64 16 92 % -- --  08/03/20 1245 -- -- -- -- --  -- 6\' 5"  (1.956 m) 123 kg  08/03/20 1216 (!) 169/92 98 F (36.7 C) Oral 68 16 96 % -- --     Recent laboratory studies:  Recent Labs    08/04/20 0345  WBC 9.9  HGB 13.3  HCT 39.4  PLT 189  NA 137  K 4.3  CL 105  CO2 25  BUN 18  CREATININE 0.81  GLUCOSE 144*  CALCIUM 8.8*     Discharge Medications:   Allergies as of 08/04/2020   No Known Allergies     Medication List    STOP taking these medications   meloxicam 15 MG tablet Commonly known as: MOBIC     TAKE these medications   albuterol 108 (90 Base) MCG/ACT inhaler Commonly known as: VENTOLIN HFA Inhale 2 puffs into the lungs every 6 (six) hours as needed for wheezing or shortness of breath.   aspirin 81 MG chewable tablet Commonly known as: Aspirin Childrens Chew 1 tablet (81 mg total) by mouth 2 (two) times daily. Take for 4 weeks, then resume regular dose. Start taking on: August 05, 2020   azithromycin 1 % ophthalmic solution Commonly known as: AZASITE Place 1 drop into both eyes daily.   budesonide-formoterol 160-4.5 MCG/ACT inhaler Commonly known as: SYMBICORT Inhale 2 puffs into the lungs 2 (two) times daily.   carbidopa-levodopa 10-100 MG tablet Commonly known as: SINEMET IR Take 1 tablet by mouth 3 (three) times daily.   cetirizine 10 MG tablet Commonly known as: ZYRTEC Take 10 mg by mouth daily.   docusate sodium 100 MG capsule Commonly known as: Colace Take 1 capsule (100 mg total) by mouth 2 (two) times daily.   doxycycline 50 MG capsule Commonly known as: VIBRAMYCIN Take 50 mg by mouth every evening.   ferrous sulfate 325 (65 FE) MG tablet Commonly known as: FerrouSul Take 1 tablet (325 mg total) by mouth 3 (three) times daily with meals for 14 days.   fluticasone 50 MCG/ACT nasal spray Commonly known as: FLONASE Place 1 spray  into both nostrils daily.   gabapentin 300 MG capsule Commonly known as: NEURONTIN Take 600 mg by mouth 2 (two) times daily. 600MG   AM/ 600 mg lunch    900MG  HS   HYDROcodone-acetaminophen 7.5-325 MG tablet Commonly known as: Norco Take 1-2 tablets by mouth every 4 (four) hours as needed for moderate pain.   ipratropium 0.03 % nasal spray Commonly known as: ATROVENT Place 1 spray into both nostrils every 12 (twelve) hours.   lamoTRIgine 200 MG tablet Commonly known as: LAMICTAL Take 200 mg by mouth at bedtime.   levothyroxine 137 MCG tablet Commonly known as: SYNTHROID Take 137 mcg by mouth daily before breakfast.   methocarbamol 500 MG tablet Commonly known as: Robaxin Take 1 tablet (500 mg total) by mouth every 6 (six) hours as needed for muscle spasms.   montelukast 10 MG tablet Commonly known as: SINGULAIR Take 10 mg by mouth daily.   polyethylene glycol 17 g packet Commonly known as: MIRALAX / GLYCOLAX Take 17 g by mouth 2 (two) times daily.   Refresh Celluvisc 1 % Gel Generic drug: Carboxymethylcellulose Sod PF Place 1 application into both eyes daily as needed (dry eyes).   Spiriva Respimat 2.5 MCG/ACT Aers Generic drug: Tiotropium Bromide Monohydrate Inhale 2 puffs into the lungs daily.   tamsulosin 0.4 MG Caps capsule Commonly known as: FLOMAX Take 0.8 mg by mouth at bedtime.   Wellbutrin XL 150 MG 24 hr tablet Generic drug: buPROPion Take 450 mg by mouth daily.   Xiidra 5 % Soln Generic drug: Lifitegrast Place 1 drop into both eyes 2 (two) times daily.            Discharge Care Instructions  (From admission, onward)         Start     Ordered   08/04/20 0000  Change dressing       Comments: Maintain surgical dressing until follow up in the clinic. If the edges start to pull up, may reinforce with tape. If the dressing is no longer working, may remove and cover with gauze and tape, but must keep the area dry and clean.  Call with any questions or concerns.   08/04/20 0845          Diagnostic Studies: No results found.  Disposition: Home  Discharge Instructions    Call MD / Call 911    Complete by: As directed    If you experience chest pain or shortness of breath, CALL 911 and be transported to the hospital emergency room.  If you develope a fever above 101 F, pus (white drainage) or increased drainage or redness at the wound, or calf pain, call your surgeon's office.   Change dressing   Complete by: As directed    Maintain surgical dressing until follow up in the clinic. If the edges start to pull up, may reinforce with tape. If the dressing is no longer working, may remove and cover with gauze and tape, but must keep the area dry and clean.  Call with any questions or concerns.   Constipation Prevention   Complete by: As directed    Drink plenty of fluids.  Prune juice may be helpful.  You may use a stool softener, such as Colace (over the counter) 100 mg twice a day.  Use MiraLax (over the counter) for constipation as needed.   Diet - low sodium heart healthy   Complete by: As directed    Discharge instructions   Complete by: As directed  Maintain surgical dressing until follow up in the clinic. If the edges start to pull up, may reinforce with tape. If the dressing is no longer working, may remove and cover with gauze and tape, but must keep the area dry and clean.  Follow up in 2 weeks at [facility]. Call with any questions or concerns.   Increase activity slowly as tolerated   Complete by: As directed    Weight bearing as tolerated with assist device (walker, cane, etc) as directed, use it as long as suggested by your surgeon or therapist, typically at least 4-6 weeks.   TED hose   Complete by: As directed    Use stockings (TED hose) for 2 weeks on both leg(s).  You may remove them at night for sleeping.       Follow-up Information    Paralee Cancel, MD. Schedule an appointment as soon as possible for a visit in 2 weeks.   Specialty: Orthopedic Surgery Contact information: [address] [place] [address] [phone]                 Signed: Lucille Passy [facility] 08/04/2020, 8:45 AM

## 2020-08-04 NOTE — Progress Notes (Signed)
     Subjective: 1 Day Post-Op Procedure(s) (LRB): TOTAL KNEE ARTHROPLASTY (Right)   Seen by Dr. Alvan Dame. Patient reports pain as mild, pain controlled. No reported events throughout the night.  Dr. Alvan Dame discussed the procedure, findings and expectations moving forward.  Ready to be discharged home, if they do well with PT.  Follow up in the clinic in 2 weeks.  Knows to call with any questions or concerns.     Patient's anticipated LOS is less than 2 midnights, meeting these requirements: - Lives within 1 hour of care - Has a competent adult at home to recover with post-op recover - NO history of  - Chronic pain requiring opiods  - Diabetes  - Coronary Artery Disease  - Heart failure  - Heart attack  - Stroke  - DVT/VTE  - Cardiac arrhythmia  - Respiratory Failure/COPD  - Renal failure  - Anemia  - Advanced Liver disease      Objective:   VITALS:   Vitals:   08/04/20 0115 08/04/20 0557  BP: 116/69 124/79  Pulse: 62 60  Resp: 17 17  Temp: 98.1 F (36.7 C) 97.6 F (36.4 C)  SpO2: 93% 97%    Dorsiflexion/Plantar flexion intact Incision: dressing C/D/I No cellulitis present Compartment soft  LABS Recent Labs    08/04/20 0345  HGB 13.3  HCT 39.4  WBC 9.9  PLT 189    Recent Labs    08/04/20 0345  NA 137  K 4.3  BUN 18  CREATININE 0.81  GLUCOSE 144*     Assessment/Plan: 1 Day Post-Op Procedure(s) (LRB): TOTAL KNEE ARTHROPLASTY (Right) Foley cath d/c'ed Advance diet Up with therapy D/C IV fluids Discharge home  Follow up in 2 weeks at [facility] Follow up with OLIN,Haron Beilke D in 2 weeks.  Contact information:  [facility] [address], [address] [place] [phone]    Obese (BMI 30-39.9) Estimated body mass index is 32.14 kg/m as calculated from the following:   Height as of this encounter: 6\' 5"  (1.956 m).   Weight as of this encounter: 123 kg. Patient also counseled that weight may inhibit the healing  process Patient counseled that losing weight will help with future health issues     Danae Orleans PA-C  [facility]  [place] [address], [place], [place] [address] Phone: [phone] [email or web address] Facebook  [employer]

## 2020-08-04 NOTE — Evaluation (Signed)
Physical Therapy One Time Evaluation Patient Details Name: Jerry Mathews MRN: [id number] DOB: 1946-07-21 Today's Date: 08/04/2020   History of Present Illness  Pt is a 74 year old male s/p R TKA  Clinical Impression  Patient evaluated by Physical Therapy with no further acute PT needs identified. All education has been completed and the patient has no further questions. Pt ambulated in hallway, practiced safe stair technique and performed LE exercises.  Pt plans to f/u with OPPT upon d/c.  Pt eager and ready for d/c home today. See below for any follow-up Physical Therapy or equipment needs. PT is signing off. Thank you for this referral.     Follow Up Recommendations Follow surgeon's recommendation for DC plan and follow-up therapies;Outpatient PT    Equipment Recommendations  None recommended by PT    Recommendations for Other Services       Precautions / Restrictions Precautions Precautions: Knee Restrictions RLE Weight Bearing: Weight bearing as tolerated      Mobility  Bed Mobility Overal bed mobility: Modified Independent                  Transfers Overall transfer level: Needs assistance Equipment used: Rolling walker (2 wheeled) Transfers: Sit to/from Stand Sit to Stand: Min guard;Supervision         General transfer comment: verbal cues for UE and LE positioning  Ambulation/Gait Ambulation/Gait assistance: Min guard;Supervision Gait Distance (Feet): 200 Feet Assistive device: Rolling walker (2 wheeled) Gait Pattern/deviations: Step-through pattern;Decreased stance time - right;Antalgic     General Gait Details: verbal cues for sequence, RW positioning, step length  Stairs Stairs: Yes Stairs assistance: Min guard Stair Management: Step to pattern;Forwards;Two rails Number of Stairs: 2 General stair comments: verbal cues for sequence and safety, pt performed twice  Wheelchair Mobility    Modified Rankin (Stroke Patients Only)        Balance                                             Pertinent Vitals/Pain Pain Assessment: 0-10 Pain Score: 4  Pain Location: posterior right knee Pain Descriptors / Indicators: Sore;Aching Pain Intervention(s): Repositioned;Monitored during session    Home Living Family/patient expects to be discharged to:: Private residence Living Arrangements: Spouse/significant other   Type of Home: House Home Access: Stairs to enter Entrance Stairs-Rails: Right;Left;Can reach both Technical brewer of Steps: 5 Home Layout: One level Home Equipment: Environmental consultant - 2 wheels;Bedside commode      Prior Function Level of Independence: Independent               Hand Dominance        Extremity/Trunk Assessment        Lower Extremity Assessment Lower Extremity Assessment: RLE deficits/detail RLE Deficits / Details: observed 90* AAROM with sitting and heel slides, able to perform SLR       Communication   Communication: No difficulties  Cognition Arousal/Alertness: Awake/alert Behavior During Therapy: WFL for tasks assessed/performed Overall Cognitive Status: Within Functional Limits for tasks assessed                                        General Comments      Exercises Total Joint Exercises Ankle Circles/Pumps: AROM;Both;10 reps Quad Sets: AROM;Both;10 reps Long  Arc Quad: AROM;Seated;Right;10 reps Knee Flexion: AAROM;Right;10 reps;Seated   Assessment/Plan    PT Assessment All further PT needs can be met in the next venue of care  PT Problem List Decreased strength;Decreased range of motion       PT Treatment Interventions      PT Goals (Current goals can be found in the Care Plan section)  Acute Rehab PT Goals PT Goal Formulation: All assessment and education complete, DC therapy    Frequency     Barriers to discharge        Co-evaluation               AM-PAC PT "6 Clicks" Mobility  Outcome Measure Help  needed turning from your back to your side while in a flat bed without using bedrails?: None Help needed moving from lying on your back to sitting on the side of a flat bed without using bedrails?: None Help needed moving to and from a bed to a chair (including a wheelchair)?: None Help needed standing up from a chair using your arms (e.g., wheelchair or bedside chair)?: A Little Help needed to walk in hospital room?: A Little Help needed climbing 3-5 steps with a railing? : A Little 6 Click Score: 21    End of Session Equipment Utilized During Treatment: Gait belt Activity Tolerance: Patient tolerated treatment well Patient left: with call bell/phone within reach;in chair Nurse Communication: Mobility status PT Visit Diagnosis: Other abnormalities of gait and mobility (R26.89)    Time: 1224-8250 PT Time Calculation (min) (ACUTE ONLY): 15 min   Charges:   PT Evaluation $PT Eval Low Complexity: 1 Low        Kati PT, DPT Acute Rehabilitation Services Pager: [phone] Office: [phone]  York Ram E 08/04/2020, 10:44 AM

## 2020-08-04 NOTE — Plan of Care (Signed)
Plan of care reviewed and discussed with the patient. 

## 2020-10-01 DIAGNOSIS — Z1152 Encounter for screening for COVID-19: Secondary | ICD-10-CM | POA: Diagnosis not present

## 2020-10-01 DIAGNOSIS — J069 Acute upper respiratory infection, unspecified: Secondary | ICD-10-CM | POA: Diagnosis not present

## 2020-10-01 DIAGNOSIS — R059 Cough, unspecified: Secondary | ICD-10-CM | POA: Diagnosis not present

## 2020-10-12 DIAGNOSIS — J3489 Other specified disorders of nose and nasal sinuses: Secondary | ICD-10-CM | POA: Diagnosis not present

## 2020-10-12 DIAGNOSIS — J029 Acute pharyngitis, unspecified: Secondary | ICD-10-CM | POA: Diagnosis not present

## 2020-10-12 DIAGNOSIS — Z20828 Contact with and (suspected) exposure to other viral communicable diseases: Secondary | ICD-10-CM | POA: Diagnosis not present

## 2020-10-12 DIAGNOSIS — J0100 Acute maxillary sinusitis, unspecified: Secondary | ICD-10-CM | POA: Diagnosis not present

## 2020-10-12 DIAGNOSIS — R070 Pain in throat: Secondary | ICD-10-CM | POA: Diagnosis not present

## 2021-02-05 DIAGNOSIS — Z20822 Contact with and (suspected) exposure to covid-19: Secondary | ICD-10-CM | POA: Diagnosis not present

## 2021-02-05 DIAGNOSIS — J440 Chronic obstructive pulmonary disease with acute lower respiratory infection: Secondary | ICD-10-CM | POA: Diagnosis not present

## 2021-02-06 DIAGNOSIS — J441 Chronic obstructive pulmonary disease with (acute) exacerbation: Secondary | ICD-10-CM | POA: Diagnosis not present

## 2021-02-26 DIAGNOSIS — J441 Chronic obstructive pulmonary disease with (acute) exacerbation: Secondary | ICD-10-CM | POA: Diagnosis not present

## 2021-04-26 DIAGNOSIS — R918 Other nonspecific abnormal finding of lung field: Secondary | ICD-10-CM | POA: Diagnosis not present

## 2021-04-26 DIAGNOSIS — R911 Solitary pulmonary nodule: Secondary | ICD-10-CM | POA: Diagnosis not present

## 2021-04-26 DIAGNOSIS — J42 Unspecified chronic bronchitis: Secondary | ICD-10-CM | POA: Diagnosis not present

## 2021-05-24 DIAGNOSIS — Z20828 Contact with and (suspected) exposure to other viral communicable diseases: Secondary | ICD-10-CM | POA: Diagnosis not present

## 2021-05-24 DIAGNOSIS — R051 Acute cough: Secondary | ICD-10-CM | POA: Diagnosis not present

## 2021-05-24 DIAGNOSIS — R0600 Dyspnea, unspecified: Secondary | ICD-10-CM | POA: Diagnosis not present

## 2021-05-24 DIAGNOSIS — R0981 Nasal congestion: Secondary | ICD-10-CM | POA: Diagnosis not present

## 2021-06-01 ENCOUNTER — Other Ambulatory Visit: Payer: Self-pay | Admitting: Radiation Oncology

## 2021-06-01 ENCOUNTER — Ambulatory Visit
Admission: RE | Admit: 2021-06-01 | Discharge: 2021-06-01 | Disposition: A | Discharge status: Home / Self Care | Payer: Self-pay | Source: Ambulatory Visit | Attending: Radiation Oncology | Admitting: Radiation Oncology

## 2021-06-01 DIAGNOSIS — C61 Malignant neoplasm of prostate: Secondary | ICD-10-CM

## 2021-06-13 NOTE — Progress Notes (Signed)
GU Location of Tumor / Histology:  Prostate Ca  If Prostate Cancer, Gleason Score is (3 + 4) and PSA is (3.51  03/15/2021)   Biopsies     Past/Anticipated interventions by urology, if any:   Dr. Tacey Ruiz Hemal/Dr. Madelyn Brunner  05/10/2021  Plan: We are arranging a visit with Dr. Rosana Hoes in [place] to discuss cryotherapy. Referral to radiation oncologist.  Past/Anticipated interventions by medical oncology, if any:   Weight changes, if any:  Gain amount 25 lbs over several months.  IPSS: 12 SHIM: 4   Bowel/Bladder complaints, if any:  No bowel issue, no bladder issues at this time.  Nausea/Vomiting, if any:  No  Pain issues, if any:  0  SAFETY ISSUES: Prior radiation?  No Pacemaker/ICD? No Possible current pregnancy? Male Is the patient on methotrexate? No  Current Complaints / other details:

## 2021-06-14 ENCOUNTER — Encounter: Payer: Self-pay | Admitting: Licensed Clinical Social Worker

## 2021-06-14 ENCOUNTER — Encounter: Payer: Self-pay | Admitting: Urology

## 2021-06-14 ENCOUNTER — Ambulatory Visit
Admission: RE | Admit: 2021-06-14 | Discharge: 2021-06-14 | Disposition: A | Discharge status: Home / Self Care | Payer: No Typology Code available for payment source | Source: Ambulatory Visit | Attending: Radiation Oncology | Admitting: Radiation Oncology

## 2021-06-14 ENCOUNTER — Encounter: Payer: Self-pay | Admitting: Radiation Oncology

## 2021-06-14 ENCOUNTER — Other Ambulatory Visit: Payer: Self-pay

## 2021-06-14 VITALS — BP 148/85 | HR 65 | Temp 98.5°F | Resp 16 | Ht 76.0 in | Wt 274.6 lb

## 2021-06-14 DIAGNOSIS — C61 Malignant neoplasm of prostate: Secondary | ICD-10-CM | POA: Insufficient documentation

## 2021-06-14 DIAGNOSIS — M129 Arthropathy, unspecified: Secondary | ICD-10-CM | POA: Diagnosis not present

## 2021-06-14 DIAGNOSIS — E039 Hypothyroidism, unspecified: Secondary | ICD-10-CM | POA: Insufficient documentation

## 2021-06-14 DIAGNOSIS — G629 Polyneuropathy, unspecified: Secondary | ICD-10-CM | POA: Diagnosis not present

## 2021-06-14 DIAGNOSIS — Z79899 Other long term (current) drug therapy: Secondary | ICD-10-CM | POA: Insufficient documentation

## 2021-06-14 DIAGNOSIS — Z86010 Personal history of colonic polyps: Secondary | ICD-10-CM | POA: Insufficient documentation

## 2021-06-14 DIAGNOSIS — Z87891 Personal history of nicotine dependence: Secondary | ICD-10-CM | POA: Diagnosis not present

## 2021-06-14 DIAGNOSIS — I10 Essential (primary) hypertension: Secondary | ICD-10-CM | POA: Insufficient documentation

## 2021-06-14 NOTE — Progress Notes (Signed)
Radiation Oncology         ([phone]) [phone] ________________________________  Initial Outpatient Consultation  Name: Jerry Mathews MRN: [id number]  Date: 06/14/2021  DOB: 03-21-1946  [id number], [employer], Veterans   REFERRING PHYSICIAN: [facility], [facility]  DIAGNOSIS: 75 y.o. gentleman with Stage T1c adenocarcinoma of the prostate with Gleason score of 3+4, and PSA of 3.51.    ICD-10-CM   1. Malignant neoplasm of prostate [facility])  C61       HISTORY OF PRESENT ILLNESS: Jerry Mathews is a 75 y.o. male with a diagnosis of prostate cancer. He was initially diagnosed with Gleason 3+3 prostate cancer in 2013. He has been under active surveillance with the [place] and reports that he was initially having a repeat surveillance biopsy every 2 years but it had been 4-5 years since his most recent. His PSA had remained stable and was 2.3 in 11/2020. More recently, he underwent surveillance prostate MRI on 02/04/21 at the [facility] showing a crescentic 12 mm area of signal abnormality in the right anterior and posterior transition zone at the level of the mid gland (PI-RADS 4), without extraglandular extension or adenopathy as well as a 7 mm focus of signal abnormality in the left posterior lateral peripheral zone at the level of the apex (PI-RADS 3).  The prostate volume was estimated to be 26 mL.  He was referred to Dr. Brendia Sacks on 03/15/21.  A repeat PSA that day was 3.51. The patient proceeded to MRI fusion biopsy of the prostate on 05/02/21.  The prostate volume measured 49 cc.  Out of 20 core biopsies, 4 were positive.  The maximum Gleason score was 3+4, and this was seen in both left anterior apex samples. Additionally, Gleason 3+3 was seen in one left anterior base sample and one left posterior apex sample.  The patient reviewed the biopsy results with his urologist and he has kindly been referred today for discussion of potential radiation treatment options.  He is adamantly not interested  in prostatectomy and had initially expressed interest in cryotherapy but has since decided that he is more interested in learning more about the radiation treatment options.   PREVIOUS RADIATION THERAPY: No  PAST MEDICAL HISTORY:  Past Medical History:  Diagnosis Date   Anxiety    Arthritis    Asthma    Depression    PTSD-- MEDICATIONS HELP- AND HIS DOG HELPS; [place]. HAS SOME MEMORY PROBLEMS WHICH PT AND HIS WIFE THINK MAY BE RELATED TO PTSD   Diverticulosis    History of colon polyps    ADENOMATOUS   Hypertension    Hypothyroidism    Neuropathy of both feet    OA (osteoarthritis) of knee    RIGHT   OSA on CPAP    Prostate cancer ([place])    [facility] [place]    PTSD (post-traumatic stress disorder)    Right knee meniscal tear       PAST SURGICAL HISTORY: Past Surgical History:  Procedure Laterality Date   CHONDROPLASTY Right 08/21/2013   Procedure:  MEDIAL PETELLAR CHONDROPLASTY;  Surgeon: Mauri Pole, MD;  [facility];  Service: Orthopedics;  Laterality: Right;   HAND SURGERY Left 2009   KNEE ARTHROSCOPY Left 06-03-2004  &  07-12-2006   DEBRIDEMENT OF MENISCUS, PARTIAL ACL TEAR AND CHONDROMALACIA   KNEE ARTHROSCOPY WITH LATERAL MENISECTOMY Right 08/21/2013   Procedure: KNEE ARTHROSCOPY WITH LATERAL MENISECTOMY;  Surgeon: Mauri Pole,  MD;  Location: [place];  Service: Orthopedics;  Laterality: Right;   KNEE ARTHROSCOPY WITH MEDIAL MENISECTOMY Right 08/21/2013   Procedure: KNEE ARTHROSCOPY WITH MEDIAL MENISECTOMY;  Surgeon: Mauri Pole, MD;  Location: [facility];  Service: Orthopedics;  Laterality: Right;   MANDIBLE RECONSTRUCTION  1976   ORIF PATELLA Left 01/19/2014   Procedure: LEFT KNEE PARTIAL PATTELECTOMY ;  Surgeon: Mauri Pole, MD;  Location: [facility];  Service: Orthopedics;  Laterality: Left;   REVISION PATELLA OF LEFT TOTAL KNEE   05-01-2011   TOTAL KNEE ARTHROPLASTY Left 11-12-2006   AND 02-05-2007  CLOSED MANIPULATION LEFT KNEE   TOTAL KNEE ARTHROPLASTY Right 08/03/2020   Procedure: TOTAL KNEE ARTHROPLASTY;  Surgeon: Paralee Cancel, MD;  Location: [facility];  Service: Orthopedics;  Laterality: Right;  70 mins   TOTAL KNEE REVISION Left 01/06/2013   Procedure: PATELLA COMPONENT REVISION / removal and replacement of patella component ;  Surgeon: Mauri Pole, MD;  Location: [facility];  Service: Orthopedics;  Laterality: Left;  pt. also has a femoral nerve block in left leg.    FAMILY HISTORY:  Family History  Problem Relation Age of Onset   Heart disease Mother    Colon cancer Neg Hx    Esophageal cancer Neg Hx    Rectal cancer Neg Hx    Stomach cancer Neg Hx     SOCIAL HISTORY:  Social History   Socioeconomic History   Marital status: Married    Spouse name: Not on file   Number of children: Not on file   Years of education: Not on file   Highest education level: Not on file  Occupational History   Not on file  Tobacco Use   Smoking status: Former    Packs/day: 1.00    Years: 25.00    Pack years: 25.00    Types: Cigarettes    Quit date: 12/31/1993    Years since quitting: 27.4   Smokeless tobacco: Never  Vaping Use   Vaping Use: Never used  Substance and Sexual Activity   Alcohol use: Yes    Comment: occasional   Drug use: No   Sexual activity: Not on file  Other Topics Concern   Not on file  Social History Narrative   Not on file   Social Determinants of Health   Financial Resource Strain: Not on file  Food Insecurity: Not on file  Transportation Needs: Not on file  Physical Activity: Not on file  Stress: Not on file  Social Connections: Not on file  Intimate Partner Violence: Not on file    ALLERGIES: Patient has no known allergies.  MEDICATIONS:  Current Outpatient Medications  Medication Sig Dispense Refill   albuterol (PROVENTIL HFA;VENTOLIN HFA) 108 (90 BASE) MCG/ACT inhaler  Inhale 2 puffs into the lungs every 6 (six) hours as needed for wheezing or shortness of breath.      azithromycin (AZASITE) 1 % ophthalmic solution Place 1 drop into both eyes daily.     budesonide-formoterol (SYMBICORT) 160-4.5 MCG/ACT inhaler Inhale 2 puffs into the lungs 2 (two) times daily.     buPROPion (WELLBUTRIN XL) 150 MG 24 hr tablet Take 450 mg by mouth daily.     carbidopa-levodopa (SINEMET IR) 10-100 MG tablet Take 1 tablet by mouth 3 (three) times daily.     Carboxymethylcellulose Sod PF (REFRESH CELLUVISC) 1 % GEL Place 1 application into both eyes daily as needed (dry eyes).     cetirizine (ZYRTEC) 10  MG tablet Take 10 mg by mouth daily.     docusate sodium (COLACE) 100 MG capsule Take 1 capsule (100 mg total) by mouth 2 (two) times daily. 28 capsule 0   doxycycline (VIBRAMYCIN) 50 MG capsule Take 50 mg by mouth every evening.     ferrous sulfate (FERROUSUL) 325 (65 FE) MG tablet Take 1 tablet (325 mg total) by mouth 3 (three) times daily with meals for 14 days. 42 tablet 0   fluticasone (FLONASE) 50 MCG/ACT nasal spray Place 1 spray into both nostrils daily.     gabapentin (NEURONTIN) 300 MG capsule Take 600 mg by mouth 2 (two) times daily. '600MG'$   AM/ 600 mg lunch   '900MG'$  HS     HYDROcodone-acetaminophen (NORCO) 7.5-325 MG tablet Take 1-2 tablets by mouth every 4 (four) hours as needed for moderate pain. 60 tablet 0   ipratropium (ATROVENT) 0.03 % nasal spray Place 1 spray into both nostrils every 12 (twelve) hours.     lamoTRIgine (LAMICTAL) 200 MG tablet Take 200 mg by mouth at bedtime.     levothyroxine (SYNTHROID) 137 MCG tablet Take 137 mcg by mouth daily before breakfast.     Lifitegrast (XIIDRA) 5 % SOLN Place 1 drop into both eyes 2 (two) times daily.     methocarbamol (ROBAXIN) 500 MG tablet Take 1 tablet (500 mg total) by mouth every 6 (six) hours as needed for muscle spasms. 40 tablet 0   montelukast (SINGULAIR) 10 MG tablet Take 10 mg by mouth daily.     polyethylene  glycol (MIRALAX / GLYCOLAX) 17 g packet Take 17 g by mouth 2 (two) times daily. 28 packet 0   tamsulosin (FLOMAX) 0.4 MG CAPS capsule Take 0.8 mg by mouth at bedtime.     Tiotropium Bromide Monohydrate (SPIRIVA RESPIMAT) 2.5 MCG/ACT AERS Inhale 2 puffs into the lungs daily.     No current facility-administered medications for this encounter.    REVIEW OF SYSTEMS:  On review of systems, the patient reports that he is doing well overall. He denies any chest pain, shortness of breath, cough, fevers, chills, night sweats, unintended weight changes. He denies any bowel disturbances, and denies abdominal pain, nausea or vomiting. He denies any new musculoskeletal or joint aches or pains. His IPSS was 12, indicating mild to moderate urinary symptoms with noctuira 3-5x/night and urgency/frequency despite taking tamsulosin as prescribed. 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 A complete review of systems is obtained and is otherwise negative.    PHYSICAL EXAM:  Wt Readings from Last 3 Encounters:  06/14/21 274 lb 9.6 oz (124.6 kg)  08/03/20 271 lb 1 oz (123 kg)  07/28/20 271 lb 1 oz (123 kg)   Temp Readings from Last 3 Encounters:  06/14/21 98.5 F (36.9 C)  08/04/20 97.6 F (36.4 C) (Oral)  06/18/16 98.2 F (36.8 C) (Oral)   BP Readings from Last 3 Encounters:  06/14/21 (!) 148/85  08/04/20 124/79  07/28/20 (!) 161/87   Pulse Readings from Last 3 Encounters:  06/14/21 65  08/04/20 60  07/28/20 63   Pain Assessment Pain Score: 0-No pain/10  In general this is a well appearing Caucasian male in no acute distress. He's alert and oriented x4 and appropriate throughout the examination. Cardiopulmonary assessment is negative for acute distress, and he exhibits normal effort.     KPS = 100  100 - Normal; no complaints; no evidence of disease. 90   - Able to carry on normal activity;  minor signs or symptoms of disease. 80   - Normal activity with effort; some signs  or symptoms of disease. 68   - Cares for self; unable to carry on normal activity or to do active work. 60   - Requires occasional assistance, but is able to care for most of his personal needs. 50   - Requires considerable assistance and frequent medical care. 67   - Disabled; requires special care and assistance. 76   - Severely disabled; hospital admission is indicated although death not imminent. 27   - Very sick; hospital admission necessary; active supportive treatment necessary. 10   - Moribund; fatal processes progressing rapidly. 0     - Dead  Karnofsky DA, Abelmann [place], Craver LS and Burchenal [facility] [phone]) The use of the nitrogen mustards in the palliative treatment of carcinoma: with particular reference to bronchogenic carcinoma Cancer 1 634-56  LABORATORY DATA:  Lab Results  Component Value Date   WBC 9.9 08/04/2020   HGB 13.3 08/04/2020   HCT 39.4 08/04/2020   MCV 92.1 08/04/2020   PLT 189 08/04/2020   Lab Results  Component Value Date   NA 137 08/04/2020   K 4.3 08/04/2020   CL 105 08/04/2020   CO2 25 08/04/2020   No results found for: ALT, AST, GGT, ALKPHOS, BILITOT   RADIOGRAPHY: No results found.    IMPRESSION/PLAN: 1. 75 y.o. gentleman with Stage T1c adenocarcinoma of the prostate with Gleason Score of 3+4, and PSA of 3.51. We discussed the patient's workup and outlined the nature of prostate cancer in this setting. The patient's T stage, Gleason's score, and PSA put him into the favorable intermediate risk group. Accordingly, he is eligible for a variety of potential treatment options including brachytherapy, 5.5 weeks of external radiation, or prostatectomy. We discussed the available radiation techniques, and focused on the details and logistics of delivery. The patient is not an ideal candidate for brachytherapy with moderate LUTS despite medical therapy. We discussed and outlined the risks, benefits, short and long-term effects associated with radiotherapy and  compared and contrasted these with prostatectomy. We discussed the role of SpaceOAR gel in reducing the rectal toxicity associated with radiotherapy.  He was encouraged to ask questions that were answered to his stated satisfaction.  At the conclusion of our conversation, the patient is interested in moving forward with 5.5 weeks of external beam therapy. We will share our discussion with his team at the [facility] and proceed with treatment planning accordingly. The patient appears to have a good understanding of his disease and our treatment recommendations which are of curative intent and is in agreement with the stated plan.  Therefore, we will move forward with coordinating a CT Simulation/treatment planning appointment in the next 1-2 weeks, in anticipation of beginning his daily radiation treatments in the near future.  He will need a note on letterhead each week to document his daily treatments so that he can turn this in to the [facility] for mileage reimbursement.   We personally spent 70 minutes in this encounter including chart review, reviewing radiological studies, meeting face-to-face with the patient, entering orders and completing documentation.    Nicholos Johns, PA-C    Tyler Pita, MD  [facility] Direct Dial: [phone]  Fax: [phone] [email or web address]  Skype  LinkedIn   This document serves as a record of services personally performed by Tyler Pita, MD and Freeman Caldron, PA-C. It was created on their behalf by Wilburn Mylar, a trained  medical scribe. The creation of this record is based on the scribe's personal observations and the provider's statements to them. This document has been checked and approved by the attending provider.

## 2021-06-14 NOTE — Progress Notes (Signed)
[place] Psychosocial Distress Screening Clinical Social Work  Clinical Social Work was referred by distress screening protocol.  The patient scored a 7 on the Psychosocial Distress Thermometer which indicates moderate distress. Clinical Social Worker  attempted to contact pt by phone  to assess for distress and other psychosocial needs.  No answer. Left VM with direct contact information.  ON[facility]N DISTRESS SCREENING 06/14/2021  Screening Type Initial Screening  Distress experienced in past week (1-10) 7  Emotional problem type Depression;Nervousness/Anxiety  Physical Problem type Sleep/insomnia;Talking     Miriya Cloer E Auron Tadros, LCSW

## 2021-06-16 ENCOUNTER — Encounter: Payer: Self-pay | Admitting: Licensed Clinical Social Worker

## 2021-06-16 ENCOUNTER — Telehealth: Payer: Self-pay | Admitting: Licensed Clinical Social Worker

## 2021-06-16 NOTE — Telephone Encounter (Signed)
[place] Work  2nd attempt to contact pt via phone to follow-up re: distress screen. No answer. Left VM with direct contact information. CSW closing referral. Pt may be referred again in the future as needed.   Christeen Douglas, LCSW

## 2021-06-16 NOTE — Progress Notes (Signed)
Jerry Mathews Psychosocial Distress Screening Clinical Social Work  Holiday representative received return TC from pt. Pt reports feeling better after visit this week and receiving more information on radiation option. He feels this is the best of the options available for him.  CSW reviewed support services and programs available. Pt is connected with the [place] and will be able to get reimbursement for mileage during his treatment. No other needs at this time. CSW encouraged pt to call back with any support/resource needs in the future.    Clinical Social Worker follow up needed: No.  If yes, follow up plan:  Lasasha Brophy E Eily Louvier, LCSW

## 2021-06-20 ENCOUNTER — Ambulatory Visit
Admission: RE | Admit: 2021-06-20 | Discharge: 2021-06-20 | Disposition: A | Discharge status: Home / Self Care | Payer: No Typology Code available for payment source | Source: Ambulatory Visit | Attending: Radiation Oncology | Admitting: Radiation Oncology

## 2021-06-20 ENCOUNTER — Other Ambulatory Visit: Payer: Self-pay

## 2021-06-20 DIAGNOSIS — Z510 Encounter for antineoplastic radiation therapy: Secondary | ICD-10-CM | POA: Insufficient documentation

## 2021-06-20 DIAGNOSIS — C61 Malignant neoplasm of prostate: Secondary | ICD-10-CM | POA: Diagnosis not present

## 2021-06-20 NOTE — Progress Notes (Signed)
  Radiation Oncology         [phone]) [phone] ________________________________  Name: Jerry Mathews MRN: [id number]  Date: 06/20/2021  DOB: [birth year]  SIMULATION AND TREATMENT PLANNING NOTE    ICD-10-CM   1. Malignant neoplasm of prostate ([place])  C61       DIAGNOSIS:  75 y.o. gentleman with Stage T1c adenocarcinoma of the prostate with Gleason score of 3+4, and PSA of 3.51.  NARRATIVE:  The patient was brought to the [place].  Identity was confirmed.  All relevant records and images related to the planned course of therapy were reviewed.  The patient freely provided informed written consent to proceed with treatment after reviewing the details related to the planned course of therapy. The consent form was witnessed and verified by the simulation staff.  Then, the patient was set-up in a stable reproducible supine position for radiation therapy.  A vacuum lock pillow device was custom fabricated to position his legs in a reproducible immobilized position.  Then, I performed a urethrogram under sterile conditions to identify the prostatic apex.  CT images were obtained.  Surface markings were placed.  The CT images were loaded into the planning software.  Then the prostate target and avoidance structures including the rectum, bladder, bowel and hips were contoured.  Treatment planning then occurred.  The radiation prescription was entered and confirmed.  A total of one complex treatment devices was fabricated. I have requested : Intensity Modulated Radiotherapy (IMRT) is medically necessary for this case for the following reason:  Rectal sparing.Marland Kitchen  PLAN:  The patient will receive 70 Gy in 28 fractions.  ________________________________  Sheral Apley Tammi Klippel, M.D.

## 2021-06-27 DIAGNOSIS — Z510 Encounter for antineoplastic radiation therapy: Secondary | ICD-10-CM | POA: Diagnosis not present

## 2021-06-30 ENCOUNTER — Ambulatory Visit
Admission: RE | Admit: 2021-06-30 | Discharge: 2021-06-30 | Disposition: A | Discharge status: Home / Self Care | Payer: No Typology Code available for payment source | Source: Ambulatory Visit | Attending: Radiation Oncology | Admitting: Radiation Oncology

## 2021-06-30 DIAGNOSIS — Z510 Encounter for antineoplastic radiation therapy: Secondary | ICD-10-CM | POA: Diagnosis not present

## 2021-07-01 ENCOUNTER — Other Ambulatory Visit: Payer: Self-pay

## 2021-07-01 ENCOUNTER — Ambulatory Visit
Admission: RE | Admit: 2021-07-01 | Discharge: 2021-07-01 | Disposition: A | Discharge status: Home / Self Care | Payer: No Typology Code available for payment source | Source: Ambulatory Visit | Attending: Radiation Oncology | Admitting: Radiation Oncology

## 2021-07-01 DIAGNOSIS — Z510 Encounter for antineoplastic radiation therapy: Secondary | ICD-10-CM | POA: Diagnosis not present

## 2021-07-01 NOTE — Progress Notes (Signed)
Pt here for patient teaching.  Pt given Radiation and You booklet and skin care instructions.  Reviewed areas of pertinence such as diarrhea, fatigue, hair loss, nausea and vomiting, sexual and fertility changes, skin changes, and urinary and bladder changes . Pt able to give teach back of to pat skin, use unscented/gentle soap, have Imodium on hand, and drink plenty of water, avoid applying anything to skin within 4 hours of treatment and to use an electric razor if they must shave. Pt verbalizes understanding of information given and will contact nursing with any questions or concerns.    [email or web address]

## 2021-07-04 ENCOUNTER — Ambulatory Visit
Admission: RE | Admit: 2021-07-04 | Discharge: 2021-07-04 | Disposition: A | Discharge status: Home / Self Care | Payer: No Typology Code available for payment source | Source: Ambulatory Visit | Attending: Radiation Oncology | Admitting: Radiation Oncology

## 2021-07-04 DIAGNOSIS — C61 Malignant neoplasm of prostate: Secondary | ICD-10-CM | POA: Insufficient documentation

## 2021-07-04 DIAGNOSIS — Z510 Encounter for antineoplastic radiation therapy: Secondary | ICD-10-CM | POA: Insufficient documentation

## 2021-07-05 ENCOUNTER — Ambulatory Visit
Admission: RE | Admit: 2021-07-05 | Discharge: 2021-07-05 | Disposition: A | Discharge status: Home / Self Care | Payer: No Typology Code available for payment source | Source: Ambulatory Visit | Attending: Radiation Oncology | Admitting: Radiation Oncology

## 2021-07-05 ENCOUNTER — Other Ambulatory Visit: Payer: Self-pay

## 2021-07-05 DIAGNOSIS — Z510 Encounter for antineoplastic radiation therapy: Secondary | ICD-10-CM | POA: Diagnosis not present

## 2021-07-06 ENCOUNTER — Ambulatory Visit
Admission: RE | Admit: 2021-07-06 | Discharge: 2021-07-06 | Disposition: A | Discharge status: Home / Self Care | Payer: No Typology Code available for payment source | Source: Ambulatory Visit | Attending: Radiation Oncology | Admitting: Radiation Oncology

## 2021-07-06 DIAGNOSIS — Z510 Encounter for antineoplastic radiation therapy: Secondary | ICD-10-CM | POA: Diagnosis not present

## 2021-07-07 ENCOUNTER — Other Ambulatory Visit: Payer: Self-pay

## 2021-07-07 ENCOUNTER — Ambulatory Visit
Admission: RE | Admit: 2021-07-07 | Discharge: 2021-07-07 | Disposition: A | Discharge status: Home / Self Care | Payer: No Typology Code available for payment source | Source: Ambulatory Visit | Attending: Radiation Oncology | Admitting: Radiation Oncology

## 2021-07-07 DIAGNOSIS — Z510 Encounter for antineoplastic radiation therapy: Secondary | ICD-10-CM | POA: Diagnosis not present

## 2021-07-08 ENCOUNTER — Ambulatory Visit
Admission: RE | Admit: 2021-07-08 | Discharge: 2021-07-08 | Disposition: A | Discharge status: Home / Self Care | Payer: No Typology Code available for payment source | Source: Ambulatory Visit | Attending: Radiation Oncology | Admitting: Radiation Oncology

## 2021-07-08 DIAGNOSIS — Z510 Encounter for antineoplastic radiation therapy: Secondary | ICD-10-CM | POA: Diagnosis not present

## 2021-07-11 ENCOUNTER — Ambulatory Visit
Admission: RE | Admit: 2021-07-11 | Discharge: 2021-07-11 | Disposition: A | Discharge status: Home / Self Care | Payer: No Typology Code available for payment source | Source: Ambulatory Visit | Attending: Radiation Oncology | Admitting: Radiation Oncology

## 2021-07-11 ENCOUNTER — Other Ambulatory Visit: Payer: Self-pay

## 2021-07-11 DIAGNOSIS — L728 Other follicular cysts of the skin and subcutaneous tissue: Secondary | ICD-10-CM | POA: Diagnosis not present

## 2021-07-11 DIAGNOSIS — L814 Other melanin hyperpigmentation: Secondary | ICD-10-CM | POA: Diagnosis not present

## 2021-07-11 DIAGNOSIS — Z510 Encounter for antineoplastic radiation therapy: Secondary | ICD-10-CM | POA: Diagnosis not present

## 2021-07-11 DIAGNOSIS — L570 Actinic keratosis: Secondary | ICD-10-CM | POA: Diagnosis not present

## 2021-07-11 DIAGNOSIS — L578 Other skin changes due to chronic exposure to nonionizing radiation: Secondary | ICD-10-CM | POA: Diagnosis not present

## 2021-07-11 DIAGNOSIS — R233 Spontaneous ecchymoses: Secondary | ICD-10-CM | POA: Diagnosis not present

## 2021-07-12 ENCOUNTER — Ambulatory Visit
Admission: RE | Admit: 2021-07-12 | Discharge: 2021-07-12 | Disposition: A | Discharge status: Home / Self Care | Payer: No Typology Code available for payment source | Source: Ambulatory Visit | Attending: Radiation Oncology | Admitting: Radiation Oncology

## 2021-07-12 DIAGNOSIS — Z510 Encounter for antineoplastic radiation therapy: Secondary | ICD-10-CM | POA: Diagnosis not present

## 2021-07-13 ENCOUNTER — Other Ambulatory Visit: Payer: Self-pay

## 2021-07-13 ENCOUNTER — Ambulatory Visit
Admission: RE | Admit: 2021-07-13 | Discharge: 2021-07-13 | Disposition: A | Discharge status: Home / Self Care | Payer: No Typology Code available for payment source | Source: Ambulatory Visit | Attending: Radiation Oncology | Admitting: Radiation Oncology

## 2021-07-13 ENCOUNTER — Telehealth: Payer: Self-pay

## 2021-07-13 DIAGNOSIS — Z510 Encounter for antineoplastic radiation therapy: Secondary | ICD-10-CM | POA: Diagnosis not present

## 2021-07-14 ENCOUNTER — Ambulatory Visit
Admission: RE | Admit: 2021-07-14 | Discharge: 2021-07-14 | Disposition: A | Discharge status: Home / Self Care | Payer: No Typology Code available for payment source | Source: Ambulatory Visit | Attending: Radiation Oncology | Admitting: Radiation Oncology

## 2021-07-14 DIAGNOSIS — Z510 Encounter for antineoplastic radiation therapy: Secondary | ICD-10-CM | POA: Diagnosis not present

## 2021-07-15 ENCOUNTER — Ambulatory Visit
Admission: RE | Admit: 2021-07-15 | Discharge: 2021-07-15 | Disposition: A | Discharge status: Home / Self Care | Payer: No Typology Code available for payment source | Source: Ambulatory Visit | Attending: Radiation Oncology | Admitting: Radiation Oncology

## 2021-07-15 ENCOUNTER — Other Ambulatory Visit: Payer: Self-pay

## 2021-07-15 DIAGNOSIS — Z510 Encounter for antineoplastic radiation therapy: Secondary | ICD-10-CM | POA: Diagnosis not present

## 2021-07-18 ENCOUNTER — Other Ambulatory Visit: Payer: Self-pay

## 2021-07-18 ENCOUNTER — Ambulatory Visit
Admission: RE | Admit: 2021-07-18 | Discharge: 2021-07-18 | Disposition: A | Discharge status: Home / Self Care | Payer: No Typology Code available for payment source | Source: Ambulatory Visit | Attending: Radiation Oncology | Admitting: Radiation Oncology

## 2021-07-18 DIAGNOSIS — Z510 Encounter for antineoplastic radiation therapy: Secondary | ICD-10-CM | POA: Diagnosis not present

## 2021-07-19 ENCOUNTER — Ambulatory Visit
Admission: RE | Admit: 2021-07-19 | Discharge: 2021-07-19 | Disposition: A | Discharge status: Home / Self Care | Payer: No Typology Code available for payment source | Source: Ambulatory Visit | Attending: Radiation Oncology | Admitting: Radiation Oncology

## 2021-07-19 DIAGNOSIS — Z510 Encounter for antineoplastic radiation therapy: Secondary | ICD-10-CM | POA: Diagnosis not present

## 2021-07-20 ENCOUNTER — Other Ambulatory Visit: Payer: Self-pay

## 2021-07-20 ENCOUNTER — Ambulatory Visit
Admission: RE | Admit: 2021-07-20 | Discharge: 2021-07-20 | Disposition: A | Discharge status: Home / Self Care | Payer: No Typology Code available for payment source | Source: Ambulatory Visit | Attending: Radiation Oncology | Admitting: Radiation Oncology

## 2021-07-20 DIAGNOSIS — Z510 Encounter for antineoplastic radiation therapy: Secondary | ICD-10-CM | POA: Diagnosis not present

## 2021-07-21 ENCOUNTER — Ambulatory Visit
Admission: RE | Admit: 2021-07-21 | Discharge: 2021-07-21 | Disposition: A | Discharge status: Home / Self Care | Payer: No Typology Code available for payment source | Source: Ambulatory Visit | Attending: Radiation Oncology | Admitting: Radiation Oncology

## 2021-07-21 DIAGNOSIS — Z510 Encounter for antineoplastic radiation therapy: Secondary | ICD-10-CM | POA: Diagnosis not present

## 2021-07-22 ENCOUNTER — Ambulatory Visit
Admission: RE | Admit: 2021-07-22 | Discharge: 2021-07-22 | Disposition: A | Discharge status: Home / Self Care | Payer: No Typology Code available for payment source | Source: Ambulatory Visit | Attending: Radiation Oncology | Admitting: Radiation Oncology

## 2021-07-22 ENCOUNTER — Other Ambulatory Visit: Payer: Self-pay

## 2021-07-22 DIAGNOSIS — Z510 Encounter for antineoplastic radiation therapy: Secondary | ICD-10-CM | POA: Diagnosis not present

## 2021-07-25 ENCOUNTER — Ambulatory Visit
Admission: RE | Admit: 2021-07-25 | Discharge: 2021-07-25 | Disposition: A | Discharge status: Home / Self Care | Payer: No Typology Code available for payment source | Source: Ambulatory Visit | Attending: Radiation Oncology | Admitting: Radiation Oncology

## 2021-07-25 ENCOUNTER — Other Ambulatory Visit: Payer: Self-pay

## 2021-07-25 DIAGNOSIS — Z510 Encounter for antineoplastic radiation therapy: Secondary | ICD-10-CM | POA: Diagnosis not present

## 2021-07-26 ENCOUNTER — Ambulatory Visit
Admission: RE | Admit: 2021-07-26 | Discharge: 2021-07-26 | Disposition: A | Discharge status: Home / Self Care | Payer: No Typology Code available for payment source | Source: Ambulatory Visit | Attending: Radiation Oncology | Admitting: Radiation Oncology

## 2021-07-26 DIAGNOSIS — Z510 Encounter for antineoplastic radiation therapy: Secondary | ICD-10-CM | POA: Diagnosis not present

## 2021-07-27 ENCOUNTER — Ambulatory Visit
Admission: RE | Admit: 2021-07-27 | Discharge: 2021-07-27 | Disposition: A | Discharge status: Home / Self Care | Payer: No Typology Code available for payment source | Source: Ambulatory Visit | Attending: Radiation Oncology | Admitting: Radiation Oncology

## 2021-07-27 ENCOUNTER — Other Ambulatory Visit: Payer: Self-pay

## 2021-07-27 DIAGNOSIS — Z510 Encounter for antineoplastic radiation therapy: Secondary | ICD-10-CM | POA: Diagnosis not present

## 2021-07-28 ENCOUNTER — Ambulatory Visit
Admission: RE | Admit: 2021-07-28 | Discharge: 2021-07-28 | Disposition: A | Discharge status: Home / Self Care | Payer: No Typology Code available for payment source | Source: Ambulatory Visit | Attending: Radiation Oncology | Admitting: Radiation Oncology

## 2021-07-28 DIAGNOSIS — Z510 Encounter for antineoplastic radiation therapy: Secondary | ICD-10-CM | POA: Diagnosis not present

## 2021-07-29 ENCOUNTER — Other Ambulatory Visit: Payer: Self-pay

## 2021-07-29 ENCOUNTER — Ambulatory Visit
Admission: RE | Admit: 2021-07-29 | Discharge: 2021-07-29 | Disposition: A | Discharge status: Home / Self Care | Payer: No Typology Code available for payment source | Source: Ambulatory Visit | Attending: Radiation Oncology | Admitting: Radiation Oncology

## 2021-07-29 DIAGNOSIS — Z510 Encounter for antineoplastic radiation therapy: Secondary | ICD-10-CM | POA: Diagnosis not present

## 2021-08-01 ENCOUNTER — Other Ambulatory Visit: Payer: Self-pay

## 2021-08-01 ENCOUNTER — Ambulatory Visit
Admission: RE | Admit: 2021-08-01 | Discharge: 2021-08-01 | Disposition: A | Discharge status: Home / Self Care | Payer: No Typology Code available for payment source | Source: Ambulatory Visit | Attending: Radiation Oncology | Admitting: Radiation Oncology

## 2021-08-01 DIAGNOSIS — Z510 Encounter for antineoplastic radiation therapy: Secondary | ICD-10-CM | POA: Diagnosis not present

## 2021-08-02 ENCOUNTER — Ambulatory Visit
Admission: RE | Admit: 2021-08-02 | Discharge: 2021-08-02 | Disposition: A | Discharge status: Home / Self Care | Payer: No Typology Code available for payment source | Source: Ambulatory Visit | Attending: Radiation Oncology | Admitting: Radiation Oncology

## 2021-08-02 DIAGNOSIS — C61 Malignant neoplasm of prostate: Secondary | ICD-10-CM | POA: Insufficient documentation

## 2021-08-02 DIAGNOSIS — Z510 Encounter for antineoplastic radiation therapy: Secondary | ICD-10-CM | POA: Diagnosis not present

## 2021-08-03 ENCOUNTER — Other Ambulatory Visit: Payer: Self-pay

## 2021-08-03 ENCOUNTER — Ambulatory Visit: Admission: RE | Admit: 2021-08-03 | Payer: No Typology Code available for payment source | Source: Ambulatory Visit

## 2021-08-04 ENCOUNTER — Ambulatory Visit
Admission: RE | Admit: 2021-08-04 | Discharge: 2021-08-04 | Disposition: A | Discharge status: Home / Self Care | Payer: No Typology Code available for payment source | Source: Ambulatory Visit | Attending: Radiation Oncology | Admitting: Radiation Oncology

## 2021-08-04 DIAGNOSIS — Z510 Encounter for antineoplastic radiation therapy: Secondary | ICD-10-CM | POA: Diagnosis not present

## 2021-08-05 ENCOUNTER — Ambulatory Visit: Payer: No Typology Code available for payment source

## 2021-08-05 ENCOUNTER — Other Ambulatory Visit: Payer: Self-pay

## 2021-08-05 ENCOUNTER — Ambulatory Visit
Admission: RE | Admit: 2021-08-05 | Discharge: 2021-08-05 | Disposition: A | Discharge status: Home / Self Care | Payer: No Typology Code available for payment source | Source: Ambulatory Visit | Attending: Radiation Oncology | Admitting: Radiation Oncology

## 2021-08-05 DIAGNOSIS — Z510 Encounter for antineoplastic radiation therapy: Secondary | ICD-10-CM | POA: Diagnosis not present

## 2021-08-08 ENCOUNTER — Other Ambulatory Visit: Payer: Self-pay

## 2021-08-08 ENCOUNTER — Ambulatory Visit
Admission: RE | Admit: 2021-08-08 | Discharge: 2021-08-08 | Disposition: A | Discharge status: Home / Self Care | Payer: No Typology Code available for payment source | Source: Ambulatory Visit | Attending: Radiation Oncology | Admitting: Radiation Oncology

## 2021-08-08 ENCOUNTER — Ambulatory Visit: Payer: No Typology Code available for payment source

## 2021-08-08 DIAGNOSIS — Z510 Encounter for antineoplastic radiation therapy: Secondary | ICD-10-CM | POA: Diagnosis not present

## 2021-08-09 ENCOUNTER — Encounter: Payer: Self-pay | Admitting: Urology

## 2021-08-09 ENCOUNTER — Ambulatory Visit
Admission: RE | Admit: 2021-08-09 | Discharge: 2021-08-09 | Disposition: A | Discharge status: Home / Self Care | Payer: No Typology Code available for payment source | Source: Ambulatory Visit | Attending: Radiation Oncology | Admitting: Radiation Oncology

## 2021-08-09 DIAGNOSIS — C61 Malignant neoplasm of prostate: Secondary | ICD-10-CM

## 2021-08-09 DIAGNOSIS — Z510 Encounter for antineoplastic radiation therapy: Secondary | ICD-10-CM | POA: Diagnosis not present

## 2021-09-13 ENCOUNTER — Encounter: Payer: Self-pay | Admitting: Urology

## 2021-09-13 NOTE — Progress Notes (Signed)
Patient states doing well. No symptoms reported at this time. Meaningful use complete.  I-PSS score of 5 (mild).Currently on Flomax 0.4mg , 1po qd. Urology follow-up not scheduled at this time.  Patient notified of 9:00am-09/13/21 TELEPHONE only appointment w/ Ashlyn Bruning PA-C and verbalized understanding.  Patient preferred contact # [phone]

## 2021-09-13 NOTE — Progress Notes (Signed)
°  Radiation Oncology         [phone]) [phone] ________________________________  Name: Jerry Mathews MRN: [id number]  Date: 08/09/2021  DOB: 10/31/1945  End of Treatment Note  Diagnosis:   75 y.o. gentleman with Stage T1c adenocarcinoma of the prostate with Gleason score of 3+4, and PSA of 3.51.     Indication for treatment:  Curative, Definitive Radiotherapy       Radiation treatment dates:   06/30/21 - 08/09/21  Site/dose:   The prostate was treated to 70 Gy in 28 fractions of 2.5 Gy  Beams/energy:   The patient was treated with IMRT using volumetric arc therapy delivering 6 MV X-rays to clockwise and counterclockwise circumferential arcs with a 90 degree collimator offset to avoid dose scalloping.  Image guidance was performed with daily cone beam CT prior to each fraction to align to gold markers in the prostate and assure proper bladder and rectal fill volumes.  Immobilization was achieved with BodyFix custom mold.  Narrative: The patient tolerated radiation treatment relatively well with onlyminor urinary irritation and modest fatigue.  He reported weak stream and nocturia x4 but denied dysuria, gross hematuria, excessive frequency, urgency, incomplete emptying or incontinence. He did not experience any bowel issues.  Plan: The patient has completed radiation treatment. He will return to radiation oncology clinic for routine followup in one month. I advised him to call or return sooner if he has any questions or concerns related to his recovery or treatment. ________________________________  Sheral Apley. Tammi Klippel, M.D.

## 2021-09-14 ENCOUNTER — Ambulatory Visit
Admission: RE | Admit: 2021-09-14 | Discharge: 2021-09-14 | Disposition: A | Discharge status: Home / Self Care | Payer: No Typology Code available for payment source | Source: Ambulatory Visit | Attending: Urology | Admitting: Urology

## 2021-09-14 DIAGNOSIS — C61 Malignant neoplasm of prostate: Secondary | ICD-10-CM | POA: Insufficient documentation

## 2021-09-14 NOTE — Progress Notes (Signed)
Radiation Oncology         [phone]) [phone] ________________________________  Name: Jerry Mathews MRN: [id number]  Date: 09/14/2021  DOB: 08/11/1946  Post Treatment Note  CC: Administration, [employer], [employer]  Diagnosis:   75 y.o. gentleman with Stage T1c adenocarcinoma of the prostate with Gleason score of 3+4, and PSA of 3.51  Interval Since Last Radiation:  5 weeks   06/30/21 - 08/09/21: The prostate was treated to 70 Gy in 28 fractions of 2.5 Gy   Narrative:  I spoke with the patient to conduct his routine scheduled 1 month follow up visit via telephone to spare the patient unnecessary potential exposure in the healthcare setting during the current COVID-19 pandemic.  The patient was notified in advance and gave permission to proceed with this visit format.  He tolerated radiation treatment relatively well with onlyminor urinary irritation and modest fatigue.  He reported weak stream and nocturia x4 but denied dysuria, gross hematuria, excessive frequency, urgency, incomplete emptying or incontinence. He did not experience any bowel issues.                              On review of systems, the patient states that he is doing well in general.  He continues with increased frequency and urgency but denies dysuria, gross hematuria, straining to void, incomplete bladder emptying or incontinence.  He continues taking Flomax nightly as prescribed.  He reports that the fatigue has resolved and he is back to attending to all of his normal daily activities which he is pleased with.  He denies any abdominal pain, nausea, vomiting, diarrhea or constipation.  He reports a healthy appetite and is maintaining his weight.  Overall, he is quite pleased with his progress to date.  ALLERGIES:  has No Known Allergies.  Meds: Current Outpatient Medications  Medication Sig Dispense Refill   albuterol (PROVENTIL HFA;VENTOLIN HFA) 108 (90 BASE) MCG/ACT inhaler Inhale 2 puffs into the lungs every  6 (six) hours as needed for wheezing or shortness of breath.      azithromycin (AZASITE) 1 % ophthalmic solution Place 1 drop into both eyes daily. (Patient not taking: Reported on 06/14/2021)     budesonide-formoterol (SYMBICORT) 160-4.5 MCG/ACT inhaler Inhale 2 puffs into the lungs 2 (two) times daily. (Patient not taking: Reported on 06/14/2021)     buPROPion (WELLBUTRIN XL) 150 MG 24 hr tablet Take 450 mg by mouth daily.     carbidopa-levodopa (SINEMET IR) 10-100 MG tablet Take 1 tablet by mouth 3 (three) times daily.     Carboxymethylcellulose Sod PF (REFRESH CELLUVISC) 1 % GEL Place 1 application into both eyes daily as needed (dry eyes).     cetirizine (ZYRTEC) 10 MG tablet Take 10 mg by mouth daily.     docusate sodium (COLACE) 100 MG capsule Take 1 capsule (100 mg total) by mouth 2 (two) times daily. (Patient not taking: Reported on 06/14/2021) 28 capsule 0   doxycycline (VIBRAMYCIN) 50 MG capsule Take 50 mg by mouth every evening.     ferrous sulfate (FERROUSUL) 325 (65 FE) MG tablet Take 1 tablet (325 mg total) by mouth 3 (three) times daily with meals for 14 days. 42 tablet 0   fluticasone (FLONASE) 50 MCG/ACT nasal spray Place 1 spray into both nostrils daily.     gabapentin (NEURONTIN) 300 MG capsule Take 600 mg by mouth 2 (two) times daily. 600MG   AM/ 600 mg lunch  900MG  HS     HYDROcodone-acetaminophen (NORCO) 7.5-325 MG tablet Take 1-2 tablets by mouth every 4 (four) hours as needed for moderate pain. (Patient not taking: Reported on 06/14/2021) 60 tablet 0   ipratropium (ATROVENT) 0.03 % nasal spray Place 1 spray into both nostrils every 12 (twelve) hours.     lamoTRIgine (LAMICTAL) 200 MG tablet Take 200 mg by mouth at bedtime.     levothyroxine (SYNTHROID) 137 MCG tablet Take 137 mcg by mouth daily before breakfast.     Lifitegrast (XIIDRA) 5 % SOLN Place 1 drop into both eyes 2 (two) times daily.     methocarbamol (ROBAXIN) 500 MG tablet Take 1 tablet (500 mg total) by mouth every  6 (six) hours as needed for muscle spasms. (Patient not taking: Reported on 06/14/2021) 40 tablet 0   montelukast (SINGULAIR) 10 MG tablet Take 10 mg by mouth daily.     polyethylene glycol (MIRALAX / GLYCOLAX) 17 g packet Take 17 g by mouth 2 (two) times daily. (Patient not taking: Reported on 06/14/2021) 28 packet 0   tamsulosin (FLOMAX) 0.4 MG CAPS capsule Take 0.8 mg by mouth at bedtime.     Tiotropium Bromide Monohydrate (SPIRIVA RESPIMAT) 2.5 MCG/ACT AERS Inhale 2 puffs into the lungs daily.     No current facility-administered medications for this encounter.    Physical Findings:  vitals were not taken for this visit.  Pain Assessment Pain Score: 0-No pain/10 Unable to assess due to telephone follow-up visit format.  Lab Findings: Lab Results  Component Value Date   WBC 9.9 08/04/2020   HGB 13.3 08/04/2020   HCT 39.4 08/04/2020   MCV 92.1 08/04/2020   PLT 189 08/04/2020     Radiographic Findings: No results found.  Impression/Plan: 1. 75 y.o. gentleman with Stage T1c adenocarcinoma of the prostate with Gleason score of 3+4, and PSA of 3.51. He will continue to follow up with urology for ongoing PSA determinations with his urologist at the [facility] in [place]. He understands what to expect with regards to PSA monitoring going forward. I will look forward to following his response to treatment via correspondence with urology, and would be happy to continue to participate in his care if clinically indicated. I talked to the patient about what to expect in the future, including his risk for erectile dysfunction and rectal bleeding. I encouraged him to call or return to the office if he has any questions regarding his previous radiation or possible radiation side effects. He was comfortable with this plan and will follow up as needed.     Nicholos Johns, PA-C

## 2021-10-27 ENCOUNTER — Telehealth: Payer: Self-pay | Admitting: *Deleted

## 2021-11-17 ENCOUNTER — Inpatient Hospital Stay: Payer: PPO | Attending: Adult Health | Admitting: *Deleted

## 2021-11-17 ENCOUNTER — Telehealth: Payer: Self-pay | Admitting: *Deleted

## 2021-11-17 ENCOUNTER — Other Ambulatory Visit: Payer: Self-pay

## 2021-12-23 ENCOUNTER — Other Ambulatory Visit: Payer: Self-pay | Admitting: Orthopedic Surgery

## 2023-01-24 ENCOUNTER — Ambulatory Visit: Payer: No Typology Code available for payment source | Admitting: Urology

## 2023-01-24 VITALS — BP 118/64 | HR 77 | Ht 76.0 in | Wt 252.4 lb

## 2023-01-24 DIAGNOSIS — Z01818 Encounter for other preprocedural examination: Secondary | ICD-10-CM

## 2023-01-24 DIAGNOSIS — Z8546 Personal history of malignant neoplasm of prostate: Secondary | ICD-10-CM | POA: Diagnosis not present

## 2023-01-24 DIAGNOSIS — N281 Cyst of kidney, acquired: Secondary | ICD-10-CM

## 2023-01-24 DIAGNOSIS — R351 Nocturia: Secondary | ICD-10-CM

## 2023-01-24 DIAGNOSIS — N401 Enlarged prostate with lower urinary tract symptoms: Secondary | ICD-10-CM

## 2023-01-24 DIAGNOSIS — N400 Enlarged prostate without lower urinary tract symptoms: Secondary | ICD-10-CM

## 2023-01-24 DIAGNOSIS — N138 Other obstructive and reflux uropathy: Secondary | ICD-10-CM

## 2023-01-24 LAB — MICROSCOPIC EXAMINATION

## 2023-01-24 LAB — URINALYSIS, COMPLETE
Bilirubin, UA: NEGATIVE
Glucose, UA: NEGATIVE
Ketones, UA: NEGATIVE
Leukocytes,UA: NEGATIVE
Nitrite, UA: NEGATIVE
Protein,UA: NEGATIVE
RBC, UA: NEGATIVE
Specific Gravity, UA: 1.015 (ref 1.005–1.030)
Urobilinogen, Ur: 0.2 mg/dL (ref 0.2–1.0)
pH, UA: 7 (ref 5.0–7.5)

## 2023-01-24 LAB — BLADDER SCAN AMB NON-IMAGING: Scan Result: 36

## 2023-01-24 NOTE — Progress Notes (Signed)
Jerry Mathews,acting as a scribe for Vanna [place], MD.,have documented all relevant documentation on the behalf of Vanna [place], MD,as directed by  Vanna [place], MD while in the presence of Vanna [place], MD.  01/24/2023 3:37 PM   Jerry Mathews 1945/12/30 [id number]  Referring provider: Tildon Husky, NP [address] [place],  [place] [address]  Chief Complaint  Patient presents with   Establish Care   Benign Prostatic Hypertrophy    HPI: 77 year-old male who referred by the [facility] for a second opinion. He has a personal history of BPH and prostate cancer. He was diagnosed with intermediate risk Gleason 3+4 prostate cancer in 2022 and underwent IMRT.  Currently, he has refractory urinary symptoms. He is managed on 0.8 mg Flomax as well as finasteride. He was also previously on ditropan but had to stop it due to dry eyes. He has undergone extensive workup at the [facility] from a urologic perspective under the care of Dr. Marjory Lies. This included a Uroflow as well as urodynamics.   He underwent urodynamics in 05/2022. Results showed decreased bladder capacity and high pressure low flow state consistent with bladder obstruction. Unfortunately, no additional details of these results were provided or available for review today.   He also has additional GU issues including chronic left hydronephrosis for which he underwent evaluation in 2017 with renal study that showed no obstruction. He also has a small lesion, probable RCC measuring 1.9 cm that is being managed on active surveillance of the kidney.   His most recent PSA was 0.863 in 07/2022.   Urologist at the [place] said that in light of his medical conditions including a personal history of prostate cancer status post radiation as well as a personal history of Parkinson's, he was deemed high risk for urinary incontinence with a TURP. He also mentioned UroLift, although questions the efficacy of this in the setting of radiation.   His most  recent surveillance scan for this left renal mass was in 10/2022. At the tim, it was deemed a Bosniak 86F 1.7 cm renal lesion.  Today, he is mostly concerned about nocturia and weak stream. His urinalysis today was negative.   Results for orders placed or performed in visit on 01/24/23  Bladder Scan (Post Void Residual) in office  Result Value Ref Range   Scan Result 36 ml     IPSS     Row Name 01/24/23 1500         International Prostate Symptom Score   How often have you had the sensation of not emptying your bladder? Less than 1 in 5     How often have you had to urinate less than every two hours? About half the time     How often have you found you stopped and started again several times when you urinated? Not at All     How often have you found it difficult to postpone urination? Less than 1 in 5 times     How often have you had a weak urinary stream? Almost always     How often have you had to strain to start urination? Less than half the time     How many times did you typically get up at night to urinate? 5 Times     Total IPSS Score 17       Quality of Life due to urinary symptoms   If you were to spend the rest of your life with your urinary condition  just the way it is now how would you feel about that? Unhappy              Score:  1-7 Mild 8-19 Moderate 20-35 Severe     PMH: Past Medical History:  Diagnosis Date   Anxiety    Arthritis    Asthma    Depression    PTSD-- MEDICATIONS HELP- AND HIS DOG HELPS; [facility] [place]. HAS SOME MEMORY PROBLEMS WHICH PT AND HIS WIFE THINK MAY BE RELATED TO PTSD   Diverticulosis    History of colon polyps    ADENOMATOUS   Hypertension    Hypothyroidism    Neuropathy of both feet    OA (osteoarthritis) of knee    RIGHT   OSA on CPAP    Prostate cancer (HCC)    [facility] [place]    PTSD (post-traumatic stress disorder)    Right knee meniscal tear      Surgical History: Past Surgical History:  Procedure Laterality Date   CHONDROPLASTY Right 08/21/2013   Procedure:  MEDIAL PETELLAR CHONDROPLASTY;  Surgeon: [name], MD;  [facility]: [facility];  Service: Orthopedics;  Laterality: Right;   HAND SURGERY Left 2009   KNEE ARTHROSCOPY Left 06-03-2004  &  07-12-2006   DEBRIDEMENT OF MENISCUS, PARTIAL ACL TEAR AND CHONDROMALACIA   KNEE ARTHROSCOPY WITH LATERAL MENISECTOMY Right 08/21/2013   Procedure: KNEE ARTHROSCOPY WITH LATERAL MENISECTOMY;  Surgeon: [name], MD;  Location: [facility];  Service: Orthopedics;  Laterality: Right;   KNEE ARTHROSCOPY WITH MEDIAL MENISECTOMY Right 08/21/2013   Procedure: KNEE ARTHROSCOPY WITH MEDIAL MENISECTOMY;  Surgeon: [name], MD;  Location: [facility];  Service: Orthopedics;  Laterality: Right;   MANDIBLE RECONSTRUCTION  1976   ORIF PATELLA Left 01/19/2014   Procedure: LEFT KNEE PARTIAL PATTELECTOMY ;  Surgeon: [name], MD;  Location: [facility];  Service: Orthopedics;  Laterality: Left;   REVISION PATELLA OF LEFT TOTAL KNEE  05-01-2011   TOTAL KNEE ARTHROPLASTY Left 11-12-2006   AND 02-05-2007  CLOSED MANIPULATION LEFT KNEE   TOTAL KNEE ARTHROPLASTY Right 08/03/2020   Procedure: TOTAL KNEE ARTHROPLASTY;  Surgeon: Durene Romans, MD;  Location: [facility];  Service: Orthopedics;  Laterality: Right;  70 mins   TOTAL KNEE REVISION Left 01/06/2013   Procedure: PATELLA COMPONENT REVISION / removal and replacement of patella component ;  Surgeon: [name], MD;  Location: [facility];  Service: Orthopedics;  Laterality: Left;  pt. also has a femoral nerve block in left leg.    Home Medications:  Allergies as of 01/24/2023   No Known Allergies      Medication List        Accurate as of January 24, 2023  3:37 PM. If you have any questions, ask your nurse or doctor.          STOP taking these medications    azithromycin 1 % ophthalmic  solution Commonly known as: AZASITE   budesonide-formoterol 160-4.5 MCG/ACT inhaler Commonly known as: SYMBICORT   docusate sodium 100 MG capsule Commonly known as: Colace   doxycycline 50 MG capsule Commonly known as: VIBRAMYCIN   ferrous sulfate 325 (65 FE) MG tablet Commonly known as: FerrouSul   HYDROcodone-acetaminophen 7.5-325 MG tablet Commonly known as: Norco   lamoTRIgine 200 MG tablet Commonly known as: LAMICTAL   methocarbamol 500 MG tablet Commonly known as: Robaxin   polyethylene glycol 17 g  packet Commonly known as: MIRALAX / GLYCOLAX   Spiriva Respimat 2.5 MCG/ACT Aers Generic drug: Tiotropium Bromide Monohydrate   Xiidra 5 % Soln Generic drug: Lifitegrast       TAKE these medications    albuterol 108 (90 Base) MCG/ACT inhaler Commonly known as: VENTOLIN HFA Inhale 2 puffs into the lungs every 6 (six) hours as needed for wheezing or shortness of breath.   buPROPion 150 MG 24 hr tablet Commonly known as: WELLBUTRIN XL Take 450 mg by mouth daily.   carbidopa-levodopa 10-100 MG tablet Commonly known as: SINEMET IR Take 1 tablet by mouth 2 (two) times daily. What changed: Another medication with the same name was removed. Continue taking this medication, and follow the directions you see here.   cetirizine 10 MG tablet Commonly known as: ZYRTEC Take 10 mg by mouth daily.   cyanocobalamin 500 MCG tablet Commonly known as: VITAMIN B12 Take 1 tablet by mouth daily.   finasteride 5 MG tablet Commonly known as: PROSCAR Take 1 tablet by mouth daily.   fluticasone 50 MCG/ACT nasal spray Commonly known as: FLONASE Place 1 spray into both nostrils daily.   fluticasone-salmeterol 250-50 MCG/ACT Aepb Commonly known as: ADVAIR Inhale 1 puff into the lungs in the morning and at bedtime.   fluticasone-salmeterol 500-50 MCG/ACT Aepb Commonly known as: ADVAIR INHALE 2 PUFFS BY MOUTH TWICE A DAY (RINSE MOUTH WELL WITH WATER AFTER EACH USE) ###    gabapentin 300 MG capsule Commonly known as: NEURONTIN Take 600 mg by mouth 2 (two) times daily. 600MG   AM/ 600 mg lunch   900MG  HS   ipratropium 0.03 % nasal spray Commonly known as: ATROVENT Place 1 spray into both nostrils every 12 (twelve) hours.   ipratropium-albuterol 0.5-2.5 (3) MG/3ML Soln Commonly known as: DUONEB 3 mL as needed.   ketotifen 0.035 % ophthalmic solution Commonly known as: ZADITOR Apply to eye.   levothyroxine 137 MCG tablet Commonly known as: SYNTHROID Take 137 mcg by mouth daily before breakfast.   loratadine 10 MG tablet Commonly known as: CLARITIN Take 10 mg by mouth daily.   montelukast 10 MG tablet Commonly known as: SINGULAIR Take 10 mg by mouth daily.   Refresh Celluvisc 1 % Gel Generic drug: Carboxymethylcellulose Sod PF Place 1 application into both eyes daily as needed (dry eyes).   tamsulosin 0.4 MG Caps capsule Commonly known as: FLOMAX Take 0.8 mg by mouth at bedtime.         Family History: Family History  Problem Relation Age of Onset   Heart disease Mother    Colon cancer Neg Hx    Esophageal cancer Neg Hx    Rectal cancer Neg Hx    Stomach cancer Neg Hx     Social History:  reports that he quit smoking about 29 years ago. His smoking use included cigarettes. He has a 25.00 pack-year smoking history. He has never used smokeless tobacco. He reports current alcohol use. He reports that he does not use drugs.   Physical Exam: BP 118/64   Pulse 77   Ht 6\' 4"  (1.93 m)   Wt 252 lb 6 oz (114.5 kg)   BMI 30.72 kg/m   Constitutional:  Alert and oriented, No acute distress. HEENT: [place] AT, moist mucus membranes.  Trachea midline, no masses. Neurologic: Grossly intact, no focal deficits, moving all 4 extremities. Psychiatric: Normal mood and affect.   Assessment & Plan:    1. BPH with outlet obstruction - Continue current management.  -  Considering UroLift as a treatment option given the patient's history of  radiation and Parkinson's disease, which increases the risk of urinary incontinence with TURP. - A cystoscopy will be performed to assess suitability for UroLift.  2. History of prostate cancer - Status post IMRT - PSA is mildly unstable  3. Left Bosniak 40F renal cyst - [facility] - Due for another scan in 10/2023 - His pre-radiation prostate volume was measure at 49 cc's.  4. Nocturia - Address potential contributing factors, including untreated sleep apnea. Recommend re-initiating CPAP use to potentially alleviate nocturia.  Return for cystoscopy.  I have reviewed the above documentation for accuracy and completeness, and I agree with the above.   [place]anna [place], MD    [facility] [address], [address] [place], [place] [address] [phone]

## 2023-01-24 NOTE — Patient Instructions (Signed)

## 2023-01-28 LAB — CULTURE, URINE COMPREHENSIVE

## 2023-02-07 ENCOUNTER — Other Ambulatory Visit: Payer: No Typology Code available for payment source | Admitting: Urology

## 2023-02-13 ENCOUNTER — Ambulatory Visit (INDEPENDENT_AMBULATORY_CARE_PROVIDER_SITE_OTHER): Payer: No Typology Code available for payment source | Admitting: Urology

## 2023-02-13 ENCOUNTER — Other Ambulatory Visit: Payer: Self-pay | Admitting: Urology

## 2023-02-13 ENCOUNTER — Encounter: Payer: Self-pay | Admitting: Urology

## 2023-02-13 VITALS — BP 147/78 | HR 71 | Ht 76.0 in | Wt 250.0 lb

## 2023-02-13 DIAGNOSIS — N99112 Postprocedural membranous urethral stricture: Secondary | ICD-10-CM

## 2023-02-13 NOTE — Progress Notes (Signed)
   02/13/23  CC:  Chief Complaint  Patient presents with   Cysto    HPI: 77 year old male with refractory urinary symptoms who presents today for cystoscopy.  Please see notes for details.  NED. A&Ox3.   No respiratory distress   Abd soft, NT, ND Normal phallus with bilateral descended testicles  Cystoscopy Procedure Note  Patient identification was confirmed, informed consent was obtained, and patient was prepped using Betadine solution.  Lidocaine jelly was administered per urethral meatus.     Pre-Procedure: - Inspection reveals a normal caliber ureteral meatus.  Procedure: The flexible cystoscope was introduced without difficulty -The scope was advanced to the level of the prostatic apex where in approximately 8 French urethral stricture was identified.  It was not traversable today.  Stricture did not appear to be dense but was relatively tight.   Assessment/ Plan:  1. 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  Risk and benefits of recurrent stricture infection and need for Foley catheter discussed.  Will need preop UA/urine culture.  He is agreeable this plan.   Vanna [place], MD

## 2023-02-13 NOTE — Progress Notes (Unsigned)
Surgical Physician Order Form [facility]  * Scheduling expectation : Next Available  *Length of Case:   *Clearance needed: no  *Anticoagulation Instructions: Hold all anticoagulants  *Aspirin Instructions: Ok to continue Aspirin  *Post-op visit Date/Instructions:   2-3 day for foley removal, 6 weeks IPSS/ PVR  *Diagnosis: Urethral Stricture  *Procedure: Cystoscopy, urethral dilation, possible OptiLume balloon urethral dilation   Additional orders: N/A  -Admit type: OUTpatient  -Anesthesia: MAC  -VTE Prophylaxis Standing Order SCD's       Other:   -Standing Lab Orders Per Anesthesia    Lab other: UA&Urine Culture  -Standing Test orders EKG/Chest x-ray per Anesthesia       Test other:   - Medications:  Ancef 2gm IV  -Other orders:  N/A

## 2023-02-13 NOTE — H&P (View-Only) (Signed)
   02/13/23  CC:  Chief Complaint  Patient presents with   Cysto    HPI: 76-year-old male with refractory urinary symptoms who presents today for cystoscopy.  Please see notes for details.  NED. A&Ox3.   No respiratory distress   Abd soft, NT, ND Normal phallus with bilateral descended testicles  Cystoscopy Procedure Note  Patient identification was confirmed, informed consent was obtained, and patient was prepped using Betadine solution.  Lidocaine jelly was administered per urethral meatus.     Pre-Procedure: - Inspection reveals a normal caliber ureteral meatus.  Procedure: The flexible cystoscope was introduced without difficulty -The scope was advanced to the level of the prostatic apex where in approximately 8 French urethral stricture was identified.  It was not traversable today.  Stricture did not appear to be dense but was relatively tight.   Assessment/ Plan:  1. 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  Risk and benefits of recurrent stricture infection and need for Foley catheter discussed.  Will need preop UA/urine culture.  He is agreeable this plan.   Leon Montoya, MD 

## 2023-02-14 ENCOUNTER — Telehealth: Payer: Self-pay

## 2023-02-14 NOTE — Telephone Encounter (Signed)
I spoke with Mr. Jerry Mathews. We have discussed possible surgery dates and Thursday May 30th, 2024 was agreed upon by all parties. Patient given information about surgery date, what to expect pre-operatively and post operatively.  We discussed that a Pre-Admission Testing office will be calling to set up the pre-op visit that will take place prior to surgery, and that these appointments are typically done over the phone with a Pre-Admissions RN. Informed patient that our office will communicate any additional care to be provided after surgery. Patients questions or concerns were discussed during our call. Advised to call our office should there be any additional information, questions or concerns that arise. Patient verbalized understanding.

## 2023-02-14 NOTE — Progress Notes (Signed)
   [facility] Surgical Posting Form  Surgery Date: Date: 03/01/2023  Surgeon: Dr. Vanna Scotland, MD  Inpt ( No  )   Outpt (Yes)   Obs ( No  )   Diagnosis: N99.112 Urethral Stricture  -CPT: 16109  Surgery: Cystoscopy with Urethral Dilation with possible Balloon Dilation using Optilume  Stop Anticoagulations: Yes, but may continue ASA  Cardiac/Medical/Pulmonary Clearance needed: no  *Orders entered into EPIC  Date: 02/14/23   *Case booked in [place]  Date: 02/14/23  *Notified pt of Surgery: Date: 02/14/23  PRE-OP UA & CX: Yes, will obtain in clinic on 02/19/2023  *Placed into Prior Authorization Work Angela Nevin Date: 02/14/23  Assistant/laser/rep:No

## 2023-02-16 ENCOUNTER — Encounter
Admission: RE | Admit: 2023-02-16 | Discharge: 2023-02-16 | Disposition: A | Discharge status: Home / Self Care | Payer: PPO | Source: Ambulatory Visit | Attending: Urology | Admitting: Urology

## 2023-02-16 ENCOUNTER — Other Ambulatory Visit: Payer: Self-pay

## 2023-02-16 DIAGNOSIS — J4489 Other specified chronic obstructive pulmonary disease: Secondary | ICD-10-CM

## 2023-02-16 DIAGNOSIS — Z01812 Encounter for preprocedural laboratory examination: Secondary | ICD-10-CM

## 2023-02-16 DIAGNOSIS — I10 Essential (primary) hypertension: Secondary | ICD-10-CM

## 2023-02-16 HISTORY — DX: Preglaucoma, unspecified, bilateral: H40.003

## 2023-02-16 NOTE — Patient Instructions (Addendum)
Your procedure is scheduled on: 03/01/23 - Thursday Report to the Registration Desk on the 1st floor of the [facility]. To find out your arrival time, please call [phone] between 1PM - 3PM on: 02/28/23 - Wednesday If your arrival time is 6:00 am, do not arrive before that time as the [facility] entrance doors do not open until 6:00 am.  REMEMBER: Instructions that are not followed completely may result in serious medical risk, up to and including death; or upon the discretion of your surgeon and anesthesiologist your surgery may need to be rescheduled.  Do not eat food or drink any liquids after midnight the night before surgery.  No gum chewing or hard candies.  One week prior to surgery: Stop Anti-inflammatories (NSAIDS) such as Advil, Aleve, Ibuprofen, Motrin, Naproxen, Naprosyn and Aspirin based products such as Excedrin, Goody's Powder, BC Powder.  Stop ANY OVER THE COUNTER supplements until after surgery.  You may however, continue to take Tylenol if needed for pain up until the day of surgery.   TAKE ONLY THESE MEDICATIONS THE MORNING OF SURGERY WITH A SIP OF WATER:  fluticasone-salmeterol (ADVAIR)  fluticasone (FLONASE)  gabapentin (NEURONTIN)  levothyroxine (SYNTHROID)  carbidopa-levodopa (SINEMET IR)  buPROPion (WELLBUTRIN XL)  finasteride (PROSCAR)   Use inhaler albuterol (PROVENTIL HFA;VENTOLIN HFA)  on the day of surgery and bring to the hospital.  No Alcohol for 24 hours before or after surgery.  No Smoking including e-cigarettes for 24 hours before surgery.  No chewable tobacco products for at least 6 hours before surgery.  No nicotine patches on the day of surgery.  Do not use any "recreational" drugs for at least a week (preferably 2 weeks) before your surgery.  Please be advised that the combination of cocaine and anesthesia may have negative outcomes, up to and including death. If you test positive for cocaine, your surgery will be cancelled.  On  the morning of surgery brush your teeth with toothpaste and water, you may rinse your mouth with mouthwash if you wish. Do not swallow any toothpaste or mouthwash.  Do not wear jewelry, make-up, hairpins, clips or nail polish.  Do not wear lotions, powders, or perfumes.   Do not shave body hair from the neck down 48 hours before surgery.  Contact lenses, hearing aids and dentures may not be worn into surgery.  Do not bring valuables to the hospital. [facility] is not responsible for any missing/lost belongings or valuables.   Notify your doctor if there is any change in your medical condition (cold, fever, infection).  Wear comfortable clothing (specific to your surgery type) to the hospital.  After surgery, you can help prevent lung complications by doing breathing exercises.  Take deep breaths and cough every 1-2 hours. Your doctor may order a device called an Incentive Spirometer to help you take deep breaths. When coughing or sneezing, hold a pillow firmly against your incision with both hands. This is called "splinting." Doing this helps protect your incision. It also decreases belly discomfort.  If you are being admitted to the hospital overnight, leave your suitcase in the car. After surgery it may be brought to your room.  In case of increased patient census, it may be necessary for you, the patient, to continue your postoperative care in the Same Day Surgery department.  If you are being discharged the day of surgery, you will not be allowed to drive home. You will need a responsible individual to drive you home and stay with you  for 24 hours after surgery.   If you are taking public transportation, you will need to have a responsible individual with you.  Please call the Pre-admissions Testing Dept. at [phone] if you have any questions about these instructions.  Surgery Visitation Policy:  Patients having surgery or a procedure may have two visitors.  Children  under the age of 76 must have an adult with them who is not the patient.  Inpatient Visitation:    Visiting hours are 7 a.m. to 8 p.m. Up to four visitors are allowed at one time in a patient room. The visitors may rotate out with other people during the day.  One visitor age 33 or older may stay with the patient overnight and must be in the room by 8 p.m.

## 2023-02-19 ENCOUNTER — Other Ambulatory Visit: Payer: No Typology Code available for payment source

## 2023-02-19 ENCOUNTER — Encounter
Admission: RE | Admit: 2023-02-19 | Discharge: 2023-02-19 | Disposition: A | Discharge status: Home / Self Care | Payer: No Typology Code available for payment source | Source: Ambulatory Visit | Attending: Urology | Admitting: Urology

## 2023-02-19 DIAGNOSIS — R001 Bradycardia, unspecified: Secondary | ICD-10-CM | POA: Diagnosis not present

## 2023-02-19 DIAGNOSIS — N99112 Postprocedural membranous urethral stricture: Secondary | ICD-10-CM

## 2023-02-19 DIAGNOSIS — I10 Essential (primary) hypertension: Secondary | ICD-10-CM

## 2023-02-19 DIAGNOSIS — J4489 Other specified chronic obstructive pulmonary disease: Secondary | ICD-10-CM | POA: Diagnosis not present

## 2023-02-19 DIAGNOSIS — Z01810 Encounter for preprocedural cardiovascular examination: Secondary | ICD-10-CM | POA: Diagnosis not present

## 2023-02-19 DIAGNOSIS — Z01818 Encounter for other preprocedural examination: Secondary | ICD-10-CM | POA: Diagnosis not present

## 2023-02-19 DIAGNOSIS — Z01812 Encounter for preprocedural laboratory examination: Secondary | ICD-10-CM

## 2023-02-19 LAB — CBC
HCT: 45.6 % (ref 39.0–52.0)
Hemoglobin: 15.6 g/dL (ref 13.0–17.0)
MCH: 31.6 pg (ref 26.0–34.0)
MCHC: 34.2 g/dL (ref 30.0–36.0)
MCV: 92.3 fL (ref 80.0–100.0)
Platelets: 219 10*3/uL (ref 150–400)
RBC: 4.94 MIL/uL (ref 4.22–5.81)
RDW: 13.3 % (ref 11.5–15.5)
WBC: 5 10*3/uL (ref 4.0–10.5)
nRBC: 0 % (ref 0.0–0.2)

## 2023-02-19 LAB — BASIC METABOLIC PANEL
Anion gap: 9 (ref 5–15)
BUN: 12 mg/dL (ref 8–23)
CO2: 26 mmol/L (ref 22–32)
Calcium: 9.1 mg/dL (ref 8.9–10.3)
Chloride: 103 mmol/L (ref 98–111)
Creatinine, Ser: 0.72 mg/dL (ref 0.61–1.24)
GFR, Estimated: >60 mL/min (ref >60)
Glucose, Bld: 93 mg/dL (ref 70–99)
Potassium: 3.8 mmol/L (ref 3.5–5.1)
Sodium: 138 mmol/L (ref 135–145)

## 2023-02-20 LAB — URINALYSIS, COMPLETE
Bilirubin, UA: NEGATIVE
Glucose, UA: NEGATIVE
Ketones, UA: NEGATIVE
Leukocytes,UA: NEGATIVE
Nitrite, UA: NEGATIVE
Protein,UA: NEGATIVE
RBC, UA: NEGATIVE
Specific Gravity, UA: 1.015 (ref 1.005–1.030)
Urobilinogen, Ur: 0.2 mg/dL (ref 0.2–1.0)
pH, UA: 7 (ref 5.0–7.5)

## 2023-02-20 LAB — MICROSCOPIC EXAMINATION

## 2023-02-21 LAB — CULTURE, URINE COMPREHENSIVE

## 2023-02-22 LAB — CULTURE, URINE COMPREHENSIVE

## 2023-03-01 ENCOUNTER — Encounter: Payer: Self-pay | Admitting: Urology

## 2023-03-01 ENCOUNTER — Encounter
Admission: RE | Disposition: A | Discharge status: Home / Self Care | Payer: Self-pay | Source: Home / Self Care | Attending: Urology

## 2023-03-01 ENCOUNTER — Ambulatory Visit: Payer: No Typology Code available for payment source | Admitting: Urgent Care

## 2023-03-01 ENCOUNTER — Ambulatory Visit
Admission: RE | Admit: 2023-03-01 | Discharge: 2023-03-01 | Disposition: A | Discharge status: Home / Self Care | Payer: No Typology Code available for payment source | Attending: Urology | Admitting: Urology

## 2023-03-01 ENCOUNTER — Other Ambulatory Visit: Payer: Self-pay

## 2023-03-01 ENCOUNTER — Ambulatory Visit: Payer: No Typology Code available for payment source | Admitting: Certified Registered Nurse Anesthetist

## 2023-03-01 DIAGNOSIS — N401 Enlarged prostate with lower urinary tract symptoms: Secondary | ICD-10-CM

## 2023-03-01 DIAGNOSIS — N3289 Other specified disorders of bladder: Secondary | ICD-10-CM | POA: Diagnosis not present

## 2023-03-01 DIAGNOSIS — N138 Other obstructive and reflux uropathy: Secondary | ICD-10-CM | POA: Diagnosis not present

## 2023-03-01 DIAGNOSIS — N323 Diverticulum of bladder: Secondary | ICD-10-CM | POA: Insufficient documentation

## 2023-03-01 DIAGNOSIS — Z8546 Personal history of malignant neoplasm of prostate: Secondary | ICD-10-CM | POA: Diagnosis not present

## 2023-03-01 DIAGNOSIS — J4489 Other specified chronic obstructive pulmonary disease: Secondary | ICD-10-CM | POA: Insufficient documentation

## 2023-03-01 DIAGNOSIS — N99112 Postprocedural membranous urethral stricture: Secondary | ICD-10-CM

## 2023-03-01 DIAGNOSIS — N35912 Unspecified bulbous urethral stricture, male: Secondary | ICD-10-CM | POA: Diagnosis not present

## 2023-03-01 DIAGNOSIS — I10 Essential (primary) hypertension: Secondary | ICD-10-CM | POA: Insufficient documentation

## 2023-03-01 DIAGNOSIS — Z87891 Personal history of nicotine dependence: Secondary | ICD-10-CM | POA: Diagnosis not present

## 2023-03-01 DIAGNOSIS — G4733 Obstructive sleep apnea (adult) (pediatric): Secondary | ICD-10-CM | POA: Insufficient documentation

## 2023-03-01 HISTORY — PX: CYSTOSCOPY WITH URETHRAL DILATATION: SHX5125

## 2023-03-01 SURGERY — CYSTOSCOPY, WITH URETHRAL DILATION
Anesthesia: General | Site: Urethra

## 2023-03-01 MED ORDER — DEXAMETHASONE SODIUM PHOSPHATE 10 MG/ML IJ SOLN
INTRAMUSCULAR | Status: AC
  Filled 2023-03-01: qty 3

## 2023-03-01 MED ORDER — OXYCODONE HCL 5 MG/5ML PO SOLN: 5.0000 mg | Freq: Once | ORAL | Status: DC | PRN

## 2023-03-01 MED ORDER — ACETAMINOPHEN 10 MG/ML IV SOLN
1000.0000 mg | Freq: Once | INTRAVENOUS | Status: DC | PRN
  Administered 2023-03-01: 1000 mg via INTRAVENOUS

## 2023-03-01 MED ORDER — LIDOCAINE HCL (CARDIAC) PF 100 MG/5ML IV SOSY
PREFILLED_SYRINGE | INTRAVENOUS | Status: DC | PRN
  Administered 2023-03-01: 100 mg via INTRAVENOUS

## 2023-03-01 MED ORDER — PROPOFOL 1000 MG/100ML IV EMUL
INTRAVENOUS | Status: AC
  Filled 2023-03-01: qty 100

## 2023-03-01 MED ORDER — OXYCODONE HCL 5 MG PO TABS: 5.0000 mg | ORAL_TABLET | Freq: Once | ORAL | Status: DC | PRN

## 2023-03-01 MED ORDER — FAMOTIDINE 20 MG PO TABS
20.0000 mg | ORAL_TABLET | Freq: Once | ORAL | Status: AC
  Administered 2023-03-01: 20 mg via ORAL

## 2023-03-01 MED ORDER — MIDAZOLAM HCL 2 MG/2ML IJ SOLN
INTRAMUSCULAR | Status: AC
  Filled 2023-03-01: qty 2

## 2023-03-01 MED ORDER — FENTANYL CITRATE (PF) 100 MCG/2ML IJ SOLN
INTRAMUSCULAR | Status: AC
  Filled 2023-03-01: qty 2

## 2023-03-01 MED ORDER — ACETAMINOPHEN 10 MG/ML IV SOLN
INTRAVENOUS | Status: AC
  Filled 2023-03-01: qty 100

## 2023-03-01 MED ORDER — OXYBUTYNIN CHLORIDE 5 MG PO TABS: 5.0000 mg | ORAL_TABLET | Freq: Three times a day (TID) | ORAL | 0 refills | Status: DC | PRN

## 2023-03-01 MED ORDER — DROPERIDOL 2.5 MG/ML IJ SOLN: 0.6250 mg | Freq: Once | INTRAMUSCULAR | Status: DC | PRN

## 2023-03-01 MED ORDER — PROMETHAZINE HCL 25 MG/ML IJ SOLN: 6.2500 mg | INTRAMUSCULAR | Status: DC | PRN

## 2023-03-01 MED ORDER — OXYCODONE HCL 5 MG PO TABS
5.0000 mg | ORAL_TABLET | Freq: Once | ORAL | Status: AC
  Administered 2023-03-01: 5 mg via ORAL

## 2023-03-01 MED ORDER — PROPOFOL 10 MG/ML IV BOLUS
INTRAVENOUS | Status: DC | PRN
  Administered 2023-03-01: 30 mg via INTRAVENOUS
  Administered 2023-03-01: 20 mg via INTRAVENOUS

## 2023-03-01 MED ORDER — LACTATED RINGERS IV SOLN: INTRAVENOUS | Status: DC

## 2023-03-01 MED ORDER — CEFAZOLIN SODIUM-DEXTROSE 2-4 GM/100ML-% IV SOLN
2.0000 g | INTRAVENOUS | Status: AC
  Administered 2023-03-01: 2 g via INTRAVENOUS

## 2023-03-01 MED ORDER — CHLORHEXIDINE GLUCONATE 0.12 % MT SOLN
OROMUCOSAL | Status: AC
  Filled 2023-03-01: qty 15

## 2023-03-01 MED ORDER — ORAL CARE MOUTH RINSE: 15.0000 mL | Freq: Once | OROMUCOSAL | Status: AC

## 2023-03-01 MED ORDER — OXYCODONE HCL 5 MG PO TABS
ORAL_TABLET | ORAL | Status: AC
  Filled 2023-03-01: qty 1

## 2023-03-01 MED ORDER — ONDANSETRON HCL 4 MG/2ML IJ SOLN
INTRAMUSCULAR | Status: AC
  Filled 2023-03-01: qty 6

## 2023-03-01 MED ORDER — MIDAZOLAM HCL 2 MG/2ML IJ SOLN
INTRAMUSCULAR | Status: DC | PRN
  Administered 2023-03-01 (×2): 1 mg via INTRAVENOUS

## 2023-03-01 MED ORDER — GLYCOPYRROLATE 0.2 MG/ML IJ SOLN
0.2000 mg | Freq: Once | INTRAMUSCULAR | Status: AC
  Administered 2023-03-01: 0.2 mg via INTRAVENOUS

## 2023-03-01 MED ORDER — FENTANYL CITRATE (PF) 100 MCG/2ML IJ SOLN
INTRAMUSCULAR | Status: DC | PRN
  Administered 2023-03-01: 25 ug via INTRAVENOUS
  Administered 2023-03-01: 50 ug via INTRAVENOUS
  Administered 2023-03-01: 25 ug via INTRAVENOUS

## 2023-03-01 MED ORDER — CEFAZOLIN SODIUM-DEXTROSE 2-4 GM/100ML-% IV SOLN
INTRAVENOUS | Status: AC
  Filled 2023-03-01: qty 100

## 2023-03-01 MED ORDER — PROPOFOL 500 MG/50ML IV EMUL
INTRAVENOUS | Status: DC | PRN
  Administered 2023-03-01: 50 ug/kg/min via INTRAVENOUS

## 2023-03-01 MED ORDER — FENTANYL CITRATE (PF) 100 MCG/2ML IJ SOLN: 25.0000 ug | INTRAMUSCULAR | Status: DC | PRN

## 2023-03-01 MED ORDER — GLYCOPYRROLATE 0.2 MG/ML IJ SOLN
INTRAMUSCULAR | Status: AC
  Filled 2023-03-01: qty 1

## 2023-03-01 MED ORDER — PROPOFOL 10 MG/ML IV BOLUS
INTRAVENOUS | Status: AC
  Filled 2023-03-01: qty 60

## 2023-03-01 MED ORDER — PROPOFOL 10 MG/ML IV BOLUS
INTRAVENOUS | Status: AC
  Filled 2023-03-01: qty 20

## 2023-03-01 MED ORDER — FAMOTIDINE 20 MG PO TABS
ORAL_TABLET | ORAL | Status: AC
  Filled 2023-03-01: qty 1

## 2023-03-01 MED ORDER — STERILE WATER FOR IRRIGATION IR SOLN
Status: DC | PRN
  Administered 2023-03-01: 3000 mL

## 2023-03-01 MED ORDER — LIDOCAINE HCL (PF) 2 % IJ SOLN
INTRAMUSCULAR | Status: AC
  Filled 2023-03-01: qty 15

## 2023-03-01 MED ORDER — CHLORHEXIDINE GLUCONATE 0.12 % MT SOLN
15.0000 mL | Freq: Once | OROMUCOSAL | Status: AC
  Administered 2023-03-01: 15 mL via OROMUCOSAL

## 2023-03-01 SURGICAL SUPPLY — 26 items
BAG DRN RND TRDRP ANRFLXCHMBR (UROLOGICAL SUPPLIES) ×1
BAG URINE DRAIN 2000ML AR STRL (UROLOGICAL SUPPLIES) ×1 IMPLANT
BALLN OPTILUME DCB 24X5X75 (BALLOONS)
BALLN OPTILUME DCB 30X3X75 (BALLOONS)
BALLN OPTILUME DCB 30X5X75 (BALLOONS) ×1
BALLOON OPTILUME DCB 24X5X75 (BALLOONS) IMPLANT
BALLOON OPTILUME DCB 30X3X75 (BALLOONS) IMPLANT
BALLOON OPTILUME DCB 30X5X75 (BALLOONS) IMPLANT
CATH FOL 2WAY LX 16X5 (CATHETERS) IMPLANT
CATH FOL 2WAY LX 18X30 (CATHETERS) IMPLANT
CATH FOLEY 2W COUNCIL 20FR 5CC (CATHETERS) IMPLANT
CATH URETHRAL DIL 7.0X29 (CATHETERS) ×1 IMPLANT
CATH URETL OPEN 5X70 (CATHETERS) IMPLANT
DEVICE INFLATION ATRION QL4015 (MISCELLANEOUS) ×1 IMPLANT
ELECT REM PT RETURN 9FT ADLT (ELECTROSURGICAL)
ELECTRODE REM PT RTRN 9FT ADLT (ELECTROSURGICAL) IMPLANT
GLOVE BIO SURGEON STRL SZ 6.5 (GLOVE) ×1 IMPLANT
GOWN STRL REUS W/ TWL LRG LVL3 (GOWN DISPOSABLE) ×2 IMPLANT
GOWN STRL REUS W/TWL LRG LVL3 (GOWN DISPOSABLE) ×2
GUIDEWIRE STR DUAL SENSOR (WIRE) ×1 IMPLANT
PACK CYSTO AR (MISCELLANEOUS) ×1 IMPLANT
SET CYSTO W/LG BORE CLAMP LF (SET/KITS/TRAYS/PACK) ×1 IMPLANT
SYR 10ML LL (SYRINGE) ×1 IMPLANT
SYR 30ML LL (SYRINGE) ×1 IMPLANT
WATER STERILE IRR 3000ML UROMA (IV SOLUTION) ×1 IMPLANT
WATER STERILE IRR 500ML POUR (IV SOLUTION) ×1 IMPLANT

## 2023-03-01 NOTE — Anesthesia Preprocedure Evaluation (Signed)
Anesthesia Evaluation  Patient identified by MRN, date of birth, ID band Patient awake    Reviewed: Allergy & Precautions, H&P , NPO status , Patient's Chart, lab work & pertinent test results, reviewed documented beta blocker date and time   Airway Mallampati: II  TM Distance: >3 FB Neck ROM: full    Dental  (+) Teeth Intact   Pulmonary asthma , sleep apnea and Continuous Positive Airway Pressure Ventilation , COPD,  COPD inhaler, former smoker   Pulmonary exam normal        Cardiovascular Exercise Tolerance: Good hypertension, On Medications negative cardio ROS Normal cardiovascular exam Rhythm:regular Rate:Normal     Neuro/Psych  PSYCHIATRIC DISORDERS Anxiety Depression     Neuromuscular disease    GI/Hepatic negative GI ROS, Neg liver ROS,,,  Endo/Other  Hypothyroidism    Renal/GU negative Renal ROS  negative genitourinary   Musculoskeletal   Abdominal   Peds  Hematology negative hematology ROS (+)   Anesthesia Other Findings Past Medical History: No date: Anxiety No date: Arthritis No date: Asthma No date: Depression     Comment:  PTSD-- MEDICATIONS HELP- AND HIS DOG HELPS; RECE[facility]               [facility] [place]. HAS SOME               MEMORY PROBLEMS WHICH PT AND HIS WIFE THINK MAY BE               RELATED TO PTSD No date: Diverticulosis No date: History of colon polyps     Comment:  ADENOMATOUS No date: Hypertension No date: Hypothyroidism No date: Neuropathy of both feet No date: OA (osteoarthritis) of knee     Comment:  RIGHT No date: OSA on CPAP No date: Preglaucoma, unspecified, bilateral No date: Prostate cancer (HCC)     Comment:  [facility] [place]  No date: PTSD (post-traumatic stress disorder) No date: Right knee meniscal tear Past Surgical History: 08/21/2013: CHONDROPLASTY; Right     Comment:  Procedure:  MEDIAL PETELLAR  CHONDROPLASTY;  Surgeon:               [name], MD;  Location: [facility];  Service: Orthopedics;  Laterality: Right; No date: COLONOSCOPY W/ POLYPECTOMY 10/03/2007: HAND SURGERY; Left 06-03-2004  &  07-12-2006: KNEE ARTHROSCOPY; Left     Comment:  DEBRIDEMENT OF MENISCUS, PARTIAL ACL TEAR AND               CHONDROMALACIA 08/21/2013: KNEE ARTHROSCOPY WITH LATERAL MENISECTOMY; Right     Comment:  Procedure: KNEE ARTHROSCOPY WITH LATERAL MENISECTOMY;                Surgeon: [name], MD;  Location: [facility];  Service: Orthopedics;  Laterality:               Right; 08/21/2013: KNEE ARTHROSCOPY WITH MEDIAL MENISECTOMY; Right     Comment:  Procedure: KNEE ARTHROSCOPY WITH MEDIAL MENISECTOMY;                Surgeon: [name], MD;  Location: [facility]               SUR[facility];  Service: Orthopedics;  Laterality:  Right; 10/02/1974: MANDIBLE RECONSTRUCTION 01/19/2014: ORIF PATELLA; Left     Comment:  Procedure: LEFT KNEE PARTIAL PATTELECTOMY ;  Surgeon:               [name], MD;  Location: [facility];  Service:               Orthopedics;  Laterality: Left; 05/01/2011: REVISION PATELLA OF LEFT TOTAL KNEE 11/12/2006: TOTAL KNEE ARTHROPLASTY; Left     Comment:  AND 02-05-2007  CLOSED MANIPULATION LEFT KNEE 08/03/2020: TOTAL KNEE ARTHROPLASTY; Right     Comment:  Procedure: TOTAL KNEE ARTHROPLASTY;  Surgeon: Durene Romans, MD;  Location: [facility];  Service: Orthopedics;                Laterality: Right;  70 mins 01/06/2013: TOTAL KNEE REVISION; Left     Comment:  Procedure: PATELLA COMPONENT REVISION / removal and               replacement of patella component ;  Surgeon: [name], MD;  Location: [facility];  Service: Orthopedics;                Laterality: Left;  pt. also has a femoral nerve block in               left leg. BMI    Body Mass Index: 30.43 kg/m      Reproductive/Obstetrics negative OB ROS                             Anesthesia Physical Anesthesia Plan  ASA: 3  Anesthesia Plan: General LMA   Post-op Pain Management:    Induction:   PONV Risk Score and Plan: 3  Airway Management Planned:   Additional Equipment:   Intra-op Plan:   Post-operative Plan:   Informed Consent: I have reviewed the patients History and Physical, chart, labs and discussed the procedure including the risks, benefits and alternatives for the proposed anesthesia with the patient or authorized representative who has indicated his/her understanding and acceptance.     Dental Advisory Given  Plan Discussed with: CRNA  Anesthesia Plan Comments:        Anesthesia Quick Evaluation

## 2023-03-01 NOTE — Interval H&P Note (Signed)
History and Physical Interval Note:  03/01/2023 7:29 AM  Jerry Mathews  has presented today for surgery, with the diagnosis of Urethral Stricture.  The various methods of treatment have been discussed with the patient and family. After consideration of risks, benefits and other options for treatment, the patient has consented to  Procedure(s): CYSTOSCOPY WITH URETHRAL DILATATION, POSSIBLE BALLOON DILATION USING OPTILUME (N/A) as a surgical intervention.  The patient's history has been reviewed, patient examined, no change in status, stable for surgery.  I have reviewed the patient's chart and labs.  Questions were answered to the patient's satisfaction.     Past Medical History:  Diagnosis Date   Anxiety    Arthritis    Asthma    Depression    PTSD-- MEDICATIONS HELP- AND HIS DOG HELPS; [facility] [place]. HAS SOME MEMORY PROBLEMS WHICH PT AND HIS WIFE THINK MAY BE RELATED TO PTSD   Diverticulosis    History of colon polyps    ADENOMATOUS   Hypertension    Hypothyroidism    Neuropathy of both feet    OA (osteoarthritis) of knee    RIGHT   OSA on CPAP    Preglaucoma, unspecified, bilateral    Prostate cancer (HCC)    [facility] [place]    PTSD (post-traumatic stress disorder)    Right knee meniscal tear    No Known Allergies  RRR CTAB   Vanna Scotland

## 2023-03-01 NOTE — Anesthesia Postprocedure Evaluation (Signed)
Anesthesia Post Note  Patient: Jerry Mathews  Procedure(s) Performed: CYSTOSCOPY WITH URETHRAL DILATATION,  BALLOON DILATION USING OPTILUME (Urethra)  Patient location during evaluation: PACU Anesthesia Type: General Level of consciousness: awake and alert Pain management: pain level controlled Vital Signs Assessment: post-procedure vital signs reviewed and stable Respiratory status: spontaneous breathing, nonlabored ventilation, respiratory function stable and patient connected to nasal cannula oxygen Cardiovascular status: blood pressure returned to baseline and stable Postop Assessment: no apparent nausea or vomiting Anesthetic complications: no   No notable events documented.   Last Vitals:  Vitals:   03/01/23 0830 03/01/23 0841  BP: 100/71   Pulse: (!) 53 (!) 49  Resp: 12 13  Temp: (!) 36.2 C   SpO2: 96% 95%    Last Pain:  Vitals:   03/01/23 0841  PainSc: 4                  Yevette Edwards

## 2023-03-01 NOTE — Op Note (Signed)
Date of procedure: 03/01/23  Preoperative diagnosis:  Bulbar urethral stricture BPH with urinary obstruction History of prostate cancer  Postoperative diagnosis:  As above  Procedure: Cystoscopy Bulbar urethral dilation with Optilume balloon Foley catheter placement over wire  Surgeon: Vanna Scotland, MD  Anesthesia: MAC  Complications: None  Intraoperative findings: 6 [place] none dense relatively short bulbar urethral stricture near the level of the sphincter.  Mildly trabeculated bladder with a widemouth diverticulum at dome.  Bilobar coaptation without a distinct median lobe or bladder neck elevation.  Sequela of radiation noted at bladder neck area, hypervascular dilated blood vessels.  EBL: Minimal  Specimens: None  Drains: 20 French council tip Foley catheter  Indication: Jerry Mathews is a 77 y.o. patient with obstructive urinary symptoms found to have a bulbar urethral stricture on cystoscopy.  After reviewing the management options for treatment, he elected to proceed with the above surgical procedure(s). We have discussed the potential benefits and risks of the procedure, side effects of the proposed treatment, the likelihood of the patient achieving the goals of the procedure, and any potential problems that might occur during the procedure or recuperation. Informed consent has been obtained.  Description of procedure:  The patient was taken to the operating room and MAC was given.  The patient was placed in the dorsal lithotomy position, prepped and draped in the usual sterile fashion, and preoperative antibiotics were administered. A preoperative time-out was performed.   A 21 French scope was advanced per urethra into the bladder.  Upon approaching the bulbar urethra, he had a stricture approximately 6 French in diameter just distal to his urinary sphincter.  I was able to advance a 5 [place] open-ended ureteral catheter through the stricture.  It appeared relatively  nontense and you could see slightly beyond the stricture.  As such, I did end up gently advancing the scope over the open-ended through the strictured area into the membranous/prostatic urethra.  He had bilobar coaptation of the prostate but no elevated bladder neck.  Upon entering to the bladder, the bladder was inspected.  It was free of any tumors masses lesions or stones.  He did have a widemouth diverticulum at the dome.  He had mild trabeculation and some changes along the bladder neck consistent with his previous radiation (dilated blood vessels which are relatively hypervascular).  The trigone was normal with clear reflux of both UOs.  I then packed the scope back through the urethra inspecting along the way.  There were no other areas of concern within the urethra.  I left a sensor wire in place.  I then advanced a Optilume balloon, cystoscopy 5 cm in length, 30 French in diameter across the bulbar urethral stricture.  I inflated the balloon to 10 atm of pressure.  I allowed it to dwell for 5 minutes time.  At the end of the procedure, the balloon was gently removed and discarded.  I advanced a 20 [place] council tip catheter over the wire into the bladder.  The balloon was filled and the wire was removed.  The patient was then cleaned and dried, repositioned in supine position, reversed with seizure, taken the PACU in stable condition.  Plan: He will return on Monday for catheter removal.  Use anticholinergic sparingly for bladder spasms if needed.  Will reassess his urinary symptoms in about 6 weeks postop, if they are not improved, he is interested in UroLift.  Vanna Scotland, M.D.

## 2023-03-01 NOTE — Discharge Instructions (Signed)
AMBULATORY SURGERY  ?DISCHARGE INSTRUCTIONS ? ? ?The drugs that you were given will stay in your system until tomorrow so for the next 24 hours you should not: ? ?Drive an automobile ?Make any legal decisions ?Drink any alcoholic beverage ? ? ?You may resume regular meals tomorrow.  Today it is better to start with liquids and gradually work up to solid foods. ? ?You may eat anything you prefer, but it is better to start with liquids, then soup and crackers, and gradually work up to solid foods. ? ? ?Please notify your doctor immediately if you have any unusual bleeding, trouble breathing, redness and pain at the surgery site, drainage, fever, or pain not relieved by medication. ? ? ? ?Additional Instructions: ? ? ? ?Please contact your physician with any problems or Same Day Surgery at [phone], Monday through Friday 6 am to 4 pm, or [place] [facility] [place] Main number at [phone].  ?

## 2023-03-01 NOTE — Transfer of Care (Signed)
Immediate Anesthesia Transfer of Care Note  Patient: Jerry Mathews  Procedure(s) Performed: CYSTOSCOPY WITH URETHRAL DILATATION,  BALLOON DILATION USING OPTILUME (Urethra)  Patient Location: PACU  Anesthesia Type:General  Level of Consciousness: awake and alert   Airway & Oxygen Therapy: Patient Spontanous Breathing and Patient connected to nasal cannula oxygen  Post-op Assessment: Report given to RN and Post -op Vital signs reviewed and stable  Post vital signs: Reviewed and stable  Last Vitals:  Vitals Value Taken Time  BP 107/67 03/01/23 0823  Temp    Pulse 51 03/01/23 0824  Resp 11 03/01/23 0824  SpO2 99 % 03/01/23 0824  Vitals shown include unvalidated device data.  Last Pain:  Vitals:   03/01/23 0608  PainSc: 0-No pain         Complications: No notable events documented.

## 2023-03-05 ENCOUNTER — Ambulatory Visit (INDEPENDENT_AMBULATORY_CARE_PROVIDER_SITE_OTHER): Payer: No Typology Code available for payment source

## 2023-03-05 ENCOUNTER — Encounter: Payer: Self-pay | Admitting: Urology

## 2023-03-05 VITALS — BP 135/72 | HR 55 | Ht 77.0 in | Wt 252.0 lb

## 2023-03-05 DIAGNOSIS — N400 Enlarged prostate without lower urinary tract symptoms: Secondary | ICD-10-CM

## 2023-03-05 LAB — BLADDER SCAN AMB NON-IMAGING: Scan Result: 0

## 2023-03-05 NOTE — Progress Notes (Signed)
Pt here for catheter removal/voiding trial.  Pt reports his daughter is a [employer] fundraiser and removed his foley catheter yesterday 03/04/23.  States he has been voiding without difficulty.  Had pt void then bladder scanned. 0 ml noted in bladder. Pt instructed to keep his postop follow up appt with Dr. Apolinar Junes and to contact the office with any concerns or problems.  Pt verbalized understanding.

## 2023-04-11 ENCOUNTER — Telehealth: Payer: No Typology Code available for payment source | Admitting: Urology

## 2023-04-11 ENCOUNTER — Encounter: Payer: Self-pay | Admitting: Urology

## 2023-04-11 DIAGNOSIS — R300 Dysuria: Secondary | ICD-10-CM | POA: Diagnosis not present

## 2023-04-11 DIAGNOSIS — N99112 Postprocedural membranous urethral stricture: Secondary | ICD-10-CM

## 2023-04-11 DIAGNOSIS — N5314 Retrograde ejaculation: Secondary | ICD-10-CM | POA: Diagnosis not present

## 2023-04-11 NOTE — Progress Notes (Signed)
Virtual Visit via Video Note  I connected with Jerry Mathews on 04/11/2023 at  9:30 AM EDT by a video enabled telemedicine application and verified that I am speaking with the correct person using two identifiers.  Location: Patient: Home Provider: Home   I discussed the limitations of evaluation and management by telemedicine and the availability of in person appointments. The patient expressed understanding and agreed to proceed.  History of Present Illness:  77 year-old male with a personal history of prostate cancer, status post IMRT with refractory urinary symptoms.   He was found to have a fairly significant bulbar urethral stricture, and work up for consideration of a UroLift. He was taken to the operating room on 03/01/2023 for bulbar urethral dilation with Optilume balloon with catheter placement over a wire. At the time, a relatively short bulbar urethral stricture was identified. Near the level of the stricture,  his bladder was noted to be mildly trabeculated with a wide mouth diverticulum at the dome.  Bilateral coarctation without a distinct median lobe was observed, along with changes associated with radiation around the trigone.  Today, his PVR is zero. He is having some urinary frequency, some burning, and mild erectile dysfunction. He is overall pleased with his urinary symptoms.     IPSS     Row Name 04/11/23 0900         International Prostate Symptom Score   How often have you had the sensation of not emptying your bladder? Not at All     How often have you had to urinate less than every two hours? Less than half the time     How often have you found it difficult to postpone urination? Not at All     How often have you had a weak urinary stream? Not at All     How often have you had to strain to start urination? Not at All     How many times did you typically get up at night to urinate? 3 Times     Total IPSS Score 5       Quality of Life due to urinary symptoms    If you were to spend the rest of your life with your urinary condition just the way it is now how would you feel about that? Pleased              Score:  1-7 Mild 8-19 Moderate 20-35 Severe   Observations/Objective:  He appears well.  Assessment and Plan:  1. Urethral stricture  - Status post-dilation with dramatic improvement in caliber, very pleased with symptoms - Pleased that this was his issue all along - We discussed that recurrence rate is higher in the setting of personal history of radiation - Follow up as needed  2. Retrograde ejaculation - He is concerned about this today, now that his urinary symptoms have improved with correction of his urethral stricture. He may no longer need this medication and we urged him to stop the medication but to resume it if his urinary symptoms return.  - Discontinue Flomax  3. Dysuria - Mild, intermittent, improving - If it fails to completely improve, return to rule out UTI  Follow Up Instructions:  Follow up as needed.    I discussed the assessment and treatment plan with the patient. The patient was provided an opportunity to ask questions and all were answered. The patient agreed with the plan and demonstrated an understanding of the instructions.   The patient  was advised to call back or seek an in-person evaluation if the symptoms worsen or if the condition fails to improve as anticipated.  I provided 10 minutes of non-face-to-face time during this encounter.  Marcelle Overlie Plume,acting as a scribe for Vanna Scotland, MD.,have documented all relevant documentation on the behalf of Vanna Scotland, MD,as directed by  Vanna Scotland, MD while in the presence of Vanna Scotland, MD.  I have reviewed the above documentation for accuracy and completeness, and I agree with the above.   Vanna Scotland, MD

## 2023-11-27 ENCOUNTER — Ambulatory Visit: Payer: PPO | Admitting: Urology

## 2023-11-30 ENCOUNTER — Encounter: Payer: Self-pay | Admitting: Physician Assistant

## 2023-11-30 ENCOUNTER — Ambulatory Visit: Payer: No Typology Code available for payment source | Admitting: Physician Assistant

## 2023-11-30 VITALS — BP 169/86 | HR 71 | Ht 77.0 in | Wt 252.0 lb

## 2023-11-30 DIAGNOSIS — R339 Retention of urine, unspecified: Secondary | ICD-10-CM | POA: Diagnosis not present

## 2023-11-30 LAB — BLADDER SCAN AMB NON-IMAGING: Scan Result: 270

## 2023-11-30 LAB — URINALYSIS, COMPLETE
Bilirubin, UA: NEGATIVE
Glucose, UA: NEGATIVE
Ketones, UA: NEGATIVE
Leukocytes,UA: NEGATIVE
Nitrite, UA: NEGATIVE
Protein,UA: NEGATIVE
RBC, UA: NEGATIVE
Specific Gravity, UA: 1.015 (ref 1.005–1.030)
Urobilinogen, Ur: 0.2 mg/dL (ref 0.2–1.0)
pH, UA: 7 (ref 5.0–7.5)

## 2023-11-30 LAB — MICROSCOPIC EXAMINATION: Bacteria, UA: NONE SEEN

## 2023-11-30 NOTE — Progress Notes (Signed)
 11/30/2023 3:45 PM   Jerry Mathews 1946/03/02 [id number]  CC: Chief Complaint  Patient presents with   Benign Prostatic Hypertrophy   HPI: Jerry Mathews is a 78 y.o. male with PMH prostate cancer s/p IMRT, BPH on Flomax and finasteride, ED, and bulbar urethral stricture s/p Optilume dilation in May 2024 who presents today for follow-up.   Today he reports a 78-month history of weakening urinary stream.  He is concerned that his stricture has recurred.  He denies dysuria or abdominal pain.  IPSS 18/mostly satisfied as below.  In-office UA and microscopy pan negative. PVR .   IPSS     Row Name 11/30/23 1500         International Prostate Symptom Score   How often have you had the sensation of not emptying your bladder? Not at All     How often have you had to urinate less than every two hours? About half the time     How often have you found you stopped and started again several times when you urinated? Less than 1 in 5 times     How often have you found it difficult to postpone urination? About half the time     How often have you had a weak urinary stream? Almost always     How often have you had to strain to start urination? About half the time     How many times did you typically get up at night to urinate? 3 Times     Total IPSS Score 18       Quality of Life due to urinary symptoms   If you were to spend the rest of your life with your urinary condition just the way it is now how would you feel about that? Mostly Satisfied               PMH: Past Medical History:  Diagnosis Date   Anxiety    Arthritis    Asthma    Depression    PTSD-- MEDICATIONS HELP- AND HIS DOG HELPS; [facility] [place]. HAS SOME MEMORY PROBLEMS WHICH PT AND HIS WIFE THINK MAY BE RELATED TO PTSD   Diverticulosis    History of colon polyps    ADENOMATOUS   Hypertension    Hypothyroidism    Neuropathy of both feet    OA (osteoarthritis) of  knee    RIGHT   OSA on CPAP    Preglaucoma, unspecified, bilateral    Prostate cancer (HCC)    [facility] [place]    PTSD (post-traumatic stress disorder)    Right knee meniscal tear     Surgical History: Past Surgical History:  Procedure Laterality Date   CHONDROPLASTY Right 08/21/2013   Procedure:  MEDIAL PETELLAR CHONDROPLASTY;  Surgeon: [name], MD;  Location[facility];  Service: Orthopedics;  Laterality: Right;   COLONOSCOPY W/ POLYPECTOMY     CYSTOSCOPY WITH URETHRAL DILATATION N/A 03/01/2023   Procedure: CYSTOSCOPY WITH URETHRAL DILATATION,  BALLOON DILATION USING OPTILUME;  Surgeon: Vanna Scotland, MD;  Location: [facility];  Service: Urology;  Laterality: N/A;   HAND SURGERY Left 10/03/2007   KNEE ARTHROSCOPY Left 06-03-2004  &  07-12-2006   DEBRIDEMENT OF MENISCUS, PARTIAL ACL TEAR AND CHONDROMALACIA   KNEE ARTHROSCOPY WITH LATERAL MENISECTOMY Right 08/21/2013   Procedure: KNEE ARTHROSCOPY WITH LATERAL MENISECTOMY;  Surgeon: [name], MD;  Location: [facility];  Service:  Orthopedics;  Laterality: Right;   KNEE ARTHROSCOPY WITH MEDIAL MENISECTOMY Right 08/21/2013   Procedure: KNEE ARTHROSCOPY WITH MEDIAL MENISECTOMY;  Surgeon: [name], MD;  Location: [facility] [place] [facility];  Service: Orthopedics;  Laterality: Right;   MANDIBLE RECONSTRUCTION  10/02/1974   ORIF PATELLA Left 01/19/2014   Procedure: LEFT KNEE PARTIAL PATTELECTOMY ;  Surgeon: [name], MD;  Location: [facility];  Service: Orthopedics;  Laterality: Left;   REVISION PATELLA OF LEFT TOTAL KNEE  05/01/2011   TOTAL KNEE ARTHROPLASTY Left 11/12/2006   AND 02-05-2007  CLOSED MANIPULATION LEFT KNEE   TOTAL KNEE ARTHROPLASTY Right 08/03/2020   Procedure: TOTAL KNEE ARTHROPLASTY;  Surgeon: Durene Romans, MD;  Location: [facility];  Service: Orthopedics;  Laterality: Right;  70 mins   TOTAL KNEE REVISION Left 01/06/2013   Procedure: PATELLA  COMPONENT REVISION / removal and replacement of patella component ;  Surgeon: [name], MD;  Location: [facility];  Service: Orthopedics;  Laterality: Left;  pt. also has a femoral nerve block in left leg.    Home Medications:  Allergies as of 11/30/2023   No Known Allergies      Medication List        Accurate as of November 30, 2023  3:45 PM. If you have any questions, ask your nurse or doctor.          STOP taking these medications    diclofenac Sodium 1 % Gel Commonly known as: VOLTAREN Stopped by: Carman Ching   fluticasone-salmeterol 250-50 MCG/ACT Aepb Commonly known as: ADVAIR Stopped by: Carman Ching   fluticasone-salmeterol 500-50 MCG/ACT Aepb Commonly known as: ADVAIR Stopped by: Carman Ching       TAKE these medications    albuterol 108 (90 Base) MCG/ACT inhaler Commonly known as: VENTOLIN HFA Inhale 2 puffs into the lungs every 6 (six) hours as needed for wheezing or shortness of breath.   buPROPion 150 MG 24 hr tablet Commonly known as: WELLBUTRIN XL Take 450 mg by mouth daily.   carbidopa-levodopa 10-100 MG tablet Commonly known as: SINEMET IR Take 1 tablet by mouth 2 (two) times daily.   cetirizine 10 MG tablet Commonly known as: ZYRTEC Take 10 mg by mouth daily.   cyanocobalamin 500 MCG tablet Commonly known as: VITAMIN B12 Take 1 tablet by mouth daily.   finasteride 5 MG tablet Commonly known as: PROSCAR Take 1 tablet by mouth daily.   fluticasone 50 MCG/ACT nasal spray Commonly known as: FLONASE Place 1 spray into both nostrils daily.   gabapentin 300 MG capsule Commonly known as: NEURONTIN Take 600 mg by mouth 2 (two) times daily. 600MG   AM/    600MG  HS   ipratropium 0.03 % nasal spray Commonly known as: ATROVENT Place 1 spray into both nostrils every 12 (twelve) hours.   ketoconazole 2 % shampoo Commonly known as: NIZORAL Apply topically.   ketotifen 0.035 % ophthalmic solution Commonly  known as: ZADITOR Apply to eye.   lamoTRIgine 200 MG tablet Commonly known as: LAMICTAL Take by mouth.   levothyroxine 137 MCG tablet Commonly known as: SYNTHROID Take 137 mcg by mouth daily before breakfast.   Lifitegrast 5 % Soln Apply to eye.   loratadine 10 MG tablet Commonly known as: CLARITIN Take 10 mg by mouth daily.   montelukast 10 MG tablet Commonly known as: SINGULAIR Take 10 mg by mouth daily.   Refresh Celluvisc 1 % Gel Generic drug: Carboxymethylcellulose Sod PF Place 1 application into both eyes daily as  needed (dry eyes).   tamsulosin 0.4 MG Caps capsule Commonly known as: FLOMAX Take by mouth.        Allergies:  No Known Allergies  Family History: Family History  Problem Relation Age of Onset   Heart disease Mother    Colon cancer Neg Hx    Esophageal cancer Neg Hx    Rectal cancer Neg Hx    Stomach cancer Neg Hx     Social History:   reports that he quit smoking about 29 years ago. His smoking use included cigarettes. He started smoking about 54 years ago. He has a 25 pack-year smoking history. He has never used smokeless tobacco. He reports current alcohol use. He reports that he does not use drugs.  Physical Exam: BP (!) 169/86   Pulse 71   Ht 6\' 5"  (1.956 m)   Wt 252 lb (114.3 kg)   BMI 29.88 kg/m   Constitutional:  Alert and oriented, no acute distress, nontoxic appearing HEENT: [place], AT Cardiovascular: No clubbing, cyanosis, or edema Respiratory: Normal respiratory effort, no increased work of breathing Skin: No rashes, bruises or suspicious lesions Neurologic: Grossly intact, no focal deficits, moving all 4 extremities Psychiatric: Normal mood and affect  Laboratory Data: Results for orders placed or performed in visit on 11/30/23  Bladder Scan (Post Void Residual) in office   Collection Time: 11/30/23  3:13 PM  Result Value Ref Range   Scan Result 270   Microscopic Examination   Collection Time: 11/30/23  3:26 PM   Urine   Result Value Ref Range   WBC, UA 0-5 0 - 5 /hpf   RBC, Urine 0-2 0 - 2 /hpf   Epithelial Cells (non renal) 0-10 0 - 10 /hpf   Bacteria, UA None seen None seen/Few  Urinalysis, Complete   Collection Time: 11/30/23  3:26 PM  Result Value Ref Range   Specific Gravity, UA 1.015 1.005 - 1.030   pH, UA 7.0 5.0 - 7.5   Color, UA Yellow Yellow   Appearance Ur Clear Clear   Leukocytes,UA Negative Negative   Protein,UA Negative Negative/Trace   Glucose, UA Negative Negative   Ketones, UA Negative Negative   RBC, UA Negative Negative   Bilirubin, UA Negative Negative   Urobilinogen, Ur 0.2 0.2 - 1.0 mg/dL   Nitrite, UA Negative Negative   Microscopic Examination See below:    Assessment & Plan:   1. Incomplete bladder emptying (Primary) Likely secondary to recurrent bulbar urethral stricture, though cannot rule out BPH.  I recommended cystoscopy with Dr. Apolinar Junes and he agreed.  He is not in overt retention, no indication for Foley catheter at this time. - Urinalysis, Complete - Bladder Scan (Post Void Residual) in office   Return in about 2 weeks (around 12/14/2023) for Cysto with Dr. Apolinar Junes.  Carman Ching, PA-C  [facility] [place] [address] [place], [place] [address] [phone]

## 2024-01-29 ENCOUNTER — Telehealth: Payer: Self-pay

## 2024-01-29 ENCOUNTER — Encounter: Payer: Self-pay | Admitting: Urology

## 2024-01-29 ENCOUNTER — Ambulatory Visit (INDEPENDENT_AMBULATORY_CARE_PROVIDER_SITE_OTHER): Payer: PPO | Admitting: Urology

## 2024-01-29 ENCOUNTER — Other Ambulatory Visit: Payer: Self-pay

## 2024-01-29 VITALS — BP 144/70 | HR 65

## 2024-01-29 DIAGNOSIS — N99112 Postprocedural membranous urethral stricture: Secondary | ICD-10-CM

## 2024-01-29 DIAGNOSIS — R339 Retention of urine, unspecified: Secondary | ICD-10-CM | POA: Diagnosis not present

## 2024-01-29 DIAGNOSIS — Z01818 Encounter for other preprocedural examination: Secondary | ICD-10-CM

## 2024-01-29 LAB — URINALYSIS, COMPLETE
Bilirubin, UA: NEGATIVE
Glucose, UA: NEGATIVE
Ketones, UA: NEGATIVE
Leukocytes,UA: NEGATIVE
Nitrite, UA: NEGATIVE
Protein,UA: NEGATIVE
RBC, UA: NEGATIVE
Specific Gravity, UA: 1.01 (ref 1.005–1.030)
Urobilinogen, Ur: 0.2 mg/dL (ref 0.2–1.0)
pH, UA: 6 (ref 5.0–7.5)

## 2024-01-29 LAB — MICROSCOPIC EXAMINATION: Bacteria, UA: NONE SEEN

## 2024-01-29 NOTE — Progress Notes (Signed)
   01/29/24  CC:  Chief Complaint  Patient presents with   Cysto    HPI: 78 year old male with history of prostate cancer status post IMRT and bulbar urethral stricture who presents today with recurrent obstructive urinary symptoms.  He is worried that his stricture is back.  Please see previous notes for details.  He underwent Optilume balloon urethral dilation in May 2024.  UA today is negative.  Blood pressure (!) 144/70, pulse 65. NED. A&Ox3.   No respiratory distress   Abd soft, NT, ND Normal phallus with bilateral descended testicles  Cystoscopy Procedure Note  Patient identification was confirmed, informed consent was obtained, and patient was prepped using Betadine  solution.  Lidocaine  jelly was administered per urethral meatus.     Pre-Procedure: - Inspection reveals a normal caliber ureteral meatus.  Procedure: The flexible cystoscope was introduced without difficulty - Scope was able to be advanced to the level of the bulbar urethra where there was approximately 10 French stricture.  This did not appear particularly dense or long.  There are also some concentric narrowed areas in the proximal pendulous urethra but easily traversable with a 18 French flexible scope.   Post-Procedure: - Patient tolerated the procedure well  Assessment/ Plan:  1. Incomplete bladder emptying (Primary) Possibly secondary to the 2  2. Postprocedural membranous urethral stricture/ Recurrent bulbar urethral stricture, symptomatic  I have offered him repeat Optilume balloon dilation as he did well with this in the past.  We discussed today learning how to self cath after the procedure in order to keep his urethra patent.  He is open to this.  Plan to keep his catheter for 1 to 2 days postop.  Risk benefits were reviewed again in detail including risk of bleeding, infection, recurrent stricture which is relatively high, damage to surrounding structures amongst others.  All questions  were answered. - Urinalysis, Complete - CULTURE, URINE COMPREHENSIVE  3. Pre-op testing - CULTURE, URINE COMPREHENSIVE  4.  History of prostate cancer - No PSA in our system since 2022, likely being followed at the [place].  Will clarify this at follow-up.   Dustin Gimenez, MD

## 2024-01-29 NOTE — Progress Notes (Signed)
   [facility] Surgical Posting Form  Surgery Date: Date: 03/10/2024  Surgeon: Dr. Dustin Gimenez, MD  Inpt ( No  )   Outpt (Yes)   Obs ( No  )   Diagnosis: N99.112 Urethral Stricture  -CPT: 16109  Surgery: Cystoscopy with Urethral Balloon Dilation using Optilume  Stop Anticoagulations: No, may continue all  Cardiac/Medical/Pulmonary Clearance needed: no  *Orders entered into EPIC  Date: 01/29/24   *Case booked in [place]  Date: 01/29/24  *Notified pt of Surgery: Date: 01/29/24  PRE-OP UA & CX: no  *Placed into Prior Authorization Work [place] Date: 01/29/24  Assistant/laser/rep:No

## 2024-01-29 NOTE — Progress Notes (Signed)
 Surgical Physician Order Form [facility] [place]  Dr. Dustin Gimenez, MD  * Scheduling expectation : Next Available  *Length of Case:   *Clearance needed: no  *Anticoagulation Instructions: May continue all anticoagulants  *Aspirin  Instructions: Ok to continue Aspirin   *Post-op visit Date/Instructions:  1-3 day cath removal  *Diagnosis: Ureteral Stricture  *Procedure:  Cysto w/urethral dilation - Optilume   Additional orders: N/A  -Admit type: OUTpatient  -Anesthesia: MAC  -VTE Prophylaxis Standing Order SCD's       Other:   -Standing Lab Orders Per Anesthesia    Lab other: None  -Standing Test orders EKG/Chest x-ray per Anesthesia       Test other:   - Medications:  Ancef  2gm IV  -Other orders:  N/A

## 2024-01-29 NOTE — Telephone Encounter (Signed)
 Per Dr. Ace Holder, Patient is to be scheduled for Cystoscopy with Urethral Balloon Dilation using Optilume   Mr. Gianni was contacted and possible surgical dates were discussed, Monday June 9th, 2025 was agreed upon for surgery.   Patient was directed to call [phone] between 1-3pm the day before surgery to find out surgical arrival time.  Instructions were given not to eat or drink from midnight on the night before surgery and have a driver for the day of surgery. On the surgery day patient was instructed to enter through the Medical Mall entrance of [facility] report the Same Day Surgery desk.   Pre-Admit Testing will be in contact via phone to set up an interview with the anesthesia team to review your history and medications prior to surgery.   Reminder of this information was sent via MyChart to the patient.

## 2024-02-01 LAB — CULTURE, URINE COMPREHENSIVE

## 2024-03-04 ENCOUNTER — Encounter
Admission: RE | Admit: 2024-03-04 | Discharge: 2024-03-04 | Disposition: A | Discharge status: Home / Self Care | Source: Ambulatory Visit | Attending: Urology | Admitting: Urology

## 2024-03-04 ENCOUNTER — Other Ambulatory Visit: Payer: Self-pay

## 2024-03-04 VITALS — Ht 77.0 in | Wt 250.0 lb

## 2024-03-04 DIAGNOSIS — I10 Essential (primary) hypertension: Secondary | ICD-10-CM

## 2024-03-04 DIAGNOSIS — J4489 Other specified chronic obstructive pulmonary disease: Secondary | ICD-10-CM

## 2024-03-04 HISTORY — DX: Personal history of irradiation: Z92.3

## 2024-03-04 HISTORY — DX: Carrier or suspected carrier of methicillin resistant Staphylococcus aureus: Z22.322

## 2024-03-04 HISTORY — DX: Parkinson's disease without dyskinesia, without mention of fluctuations: G20.A1

## 2024-03-04 NOTE — Patient Instructions (Addendum)
 Your procedure is scheduled on: Monday 03/10/24 To find out your arrival time, please call [phone] between 1PM - 3PM on:  Friday 03/07/24  Report to the Registration Desk on the 1st floor of the Medical Mall. FREE Valet parking is available.  If your arrival time is 6:00 am, do not arrive before that time as the Medical Mall entrance doors do not open until 6:00 am.  REMEMBER: Instructions that are not followed completely may result in serious medical risk, up to and including death; or upon the discretion of your surgeon and anesthesiologist your surgery may need to be rescheduled.  Do not eat food or drink any liquids after midnight the night before surgery.  No gum chewing or hard candies.  One week prior to surgery: Stop Anti-inflammatories (NSAIDS) such as Advil, Aleve, Ibuprofen, Motrin, Naproxen, Naprosyn and Aspirin  based products such as Excedrin, Goody's Powder, BC Powder. You may however, continue to take Tylenol  if needed for pain up until the day of surgery.  Stop ANY OVER THE COUNTER supplements and vitamins until after surgery.  Continue taking all prescribed medications.   TAKE ONLY THESE MEDICATIONS THE MORNING OF SURGERY WITH A SIP OF WATER :  busPIRone (BUSPAR) 15 MG tablet  carbidopa -levodopa  (SINEMET  IR) 25-100 MG tablet  levothyroxine  (SYNTHROID ) 137 MCG tablet   Use inhalers on the day of surgery and bring your Albuterol  inhaler to the hospital.  No Alcohol  for 24 hours before or after surgery.  No Smoking including e-cigarettes for 24 hours before surgery.  No chewable tobacco products for at least 6 hours before surgery.  No nicotine patches on the day of surgery.  Do not use any "recreational" drugs for at least a week (preferably 2 weeks) before your surgery.  Please be advised that the combination of cocaine and anesthesia may have negative outcomes, up to and including death. If you test positive for cocaine, your surgery will be cancelled.  On  the morning of surgery brush your teeth with toothpaste and water , you may rinse your mouth with mouthwash if you wish. Do not swallow any toothpaste or mouthwash.  Shower before arriving for your procedure  Do not wear lotions, powders, or perfumes.   Do not shave body hair from the neck down 48 hours before surgery.  Wear comfortable clothing (specific to your surgery type) to the hospital.  Do not wear jewelry, make-up, hairpins, clips or nail polish.  For welded (permanent) jewelry: bracelets, anklets, waist bands, etc.  Please have this removed prior to surgery.  If it is not removed, there is a chance that hospital personnel will need to cut it off on the day of surgery. Contact lenses, hearing aids and dentures may not be worn into surgery.  Do not bring valuables to the hospital. [facility] is not responsible for any missing/lost belongings or valuables.   Notify your doctor if there is any change in your medical condition (cold, fever, infection).  If you are being discharged the day of surgery, you will not be allowed to drive home. You will need a responsible individual to drive you home and stay with you for 24 hours after surgery.   After surgery, you can help prevent lung complications by doing breathing exercises.  Take deep breaths and cough every 1-2 hours. Your doctor may order a device called an Incentive Spirometer to help you take deep breaths.  Surgery Visitation Policy:  Patients undergoing a surgery or procedure may have two family members or support persons  with them as long as the person is not COVID-19 positive or experiencing its symptoms.   Please call the Pre-admissions Testing Dept. at [phone] if you have any questions about these instructions.

## 2024-03-05 ENCOUNTER — Encounter
Admission: RE | Admit: 2024-03-05 | Discharge: 2024-03-05 | Disposition: A | Discharge status: Home / Self Care | Source: Ambulatory Visit | Attending: Urology | Admitting: Urology

## 2024-03-05 DIAGNOSIS — I10 Essential (primary) hypertension: Secondary | ICD-10-CM | POA: Diagnosis not present

## 2024-03-05 DIAGNOSIS — J4489 Other specified chronic obstructive pulmonary disease: Secondary | ICD-10-CM | POA: Insufficient documentation

## 2024-03-05 DIAGNOSIS — Z01818 Encounter for other preprocedural examination: Secondary | ICD-10-CM | POA: Insufficient documentation

## 2024-03-05 LAB — CBC
HCT: 43.9 % (ref 39.0–52.0)
Hemoglobin: 14.9 g/dL (ref 13.0–17.0)
MCH: 31.2 pg (ref 26.0–34.0)
MCHC: 33.9 g/dL (ref 30.0–36.0)
MCV: 92 fL (ref 80.0–100.0)
Platelets: 229 10*3/uL (ref 150–400)
RBC: 4.77 MIL/uL (ref 4.22–5.81)
RDW: 12.7 % (ref 11.5–15.5)
WBC: 6.3 10*3/uL (ref 4.0–10.5)
nRBC: 0 % (ref 0.0–0.2)

## 2024-03-09 MED ORDER — ORAL CARE MOUTH RINSE: 15.0000 mL | Freq: Once | OROMUCOSAL | Status: AC

## 2024-03-09 MED ORDER — CHLORHEXIDINE GLUCONATE 0.12 % MT SOLN
15.0000 mL | Freq: Once | OROMUCOSAL | Status: AC
  Administered 2024-03-10: 15 mL via OROMUCOSAL

## 2024-03-09 MED ORDER — LACTATED RINGERS IV SOLN: INTRAVENOUS | Status: DC

## 2024-03-09 MED ORDER — CEFAZOLIN SODIUM-DEXTROSE 2-4 GM/100ML-% IV SOLN
2.0000 g | INTRAVENOUS | Status: AC
  Administered 2024-03-10: 2 g via INTRAVENOUS

## 2024-03-10 ENCOUNTER — Ambulatory Visit: Admitting: Anesthesiology

## 2024-03-10 ENCOUNTER — Other Ambulatory Visit: Payer: Self-pay

## 2024-03-10 ENCOUNTER — Ambulatory Visit
Admission: RE | Admit: 2024-03-10 | Discharge: 2024-03-10 | Disposition: A | Discharge status: Home / Self Care | Attending: Urology | Admitting: Urology

## 2024-03-10 ENCOUNTER — Encounter: Payer: Self-pay | Admitting: Urology

## 2024-03-10 ENCOUNTER — Encounter
Admission: RE | Disposition: A | Discharge status: Home / Self Care | Payer: Self-pay | Source: Home / Self Care | Attending: Urology

## 2024-03-10 DIAGNOSIS — Z8546 Personal history of malignant neoplasm of prostate: Secondary | ICD-10-CM

## 2024-03-10 DIAGNOSIS — N138 Other obstructive and reflux uropathy: Secondary | ICD-10-CM

## 2024-03-10 DIAGNOSIS — N35912 Unspecified bulbous urethral stricture, male: Secondary | ICD-10-CM | POA: Diagnosis not present

## 2024-03-10 DIAGNOSIS — N99112 Postprocedural membranous urethral stricture: Secondary | ICD-10-CM

## 2024-03-10 DIAGNOSIS — N35819 Other urethral stricture, male, unspecified site: Secondary | ICD-10-CM | POA: Diagnosis present

## 2024-03-10 DIAGNOSIS — I10 Essential (primary) hypertension: Secondary | ICD-10-CM | POA: Insufficient documentation

## 2024-03-10 DIAGNOSIS — N401 Enlarged prostate with lower urinary tract symptoms: Secondary | ICD-10-CM

## 2024-03-10 DIAGNOSIS — Z87891 Personal history of nicotine dependence: Secondary | ICD-10-CM | POA: Insufficient documentation

## 2024-03-10 HISTORY — PX: CYSTOSCOPY WITH URETHRAL DILATATION: SHX5125

## 2024-03-10 SURGERY — CYSTOSCOPY, WITH URETHRAL DILATION
Anesthesia: General | Site: Urethra

## 2024-03-10 MED ORDER — STERILE WATER FOR IRRIGATION IR SOLN
Status: DC | PRN
  Administered 2024-03-10: 500 mL

## 2024-03-10 MED ORDER — ONDANSETRON HCL 4 MG/2ML IJ SOLN
INTRAMUSCULAR | Status: AC
  Filled 2024-03-10: qty 2

## 2024-03-10 MED ORDER — ONDANSETRON HCL 4 MG/2ML IJ SOLN
INTRAMUSCULAR | Status: DC | PRN
  Administered 2024-03-10: 4 mg via INTRAVENOUS

## 2024-03-10 MED ORDER — LIDOCAINE HCL (CARDIAC) PF 100 MG/5ML IV SOSY
PREFILLED_SYRINGE | INTRAVENOUS | Status: DC | PRN
  Administered 2024-03-10: 50 mg via INTRAVENOUS

## 2024-03-10 MED ORDER — CHLORHEXIDINE GLUCONATE 0.12 % MT SOLN
OROMUCOSAL | Status: AC
  Filled 2024-03-10: qty 15

## 2024-03-10 MED ORDER — FENTANYL CITRATE (PF) 100 MCG/2ML IJ SOLN
INTRAMUSCULAR | Status: AC
Start: 2024-03-10 — End: ?
  Filled 2024-03-10: qty 2

## 2024-03-10 MED ORDER — PROPOFOL 500 MG/50ML IV EMUL
INTRAVENOUS | Status: DC | PRN
  Administered 2024-03-10: 40 mg via INTRAVENOUS
  Administered 2024-03-10: 100 ug/kg/min via INTRAVENOUS
  Administered 2024-03-10: 40 mg via INTRAVENOUS

## 2024-03-10 MED ORDER — PROPOFOL 10 MG/ML IV BOLUS
INTRAVENOUS | Status: AC
  Filled 2024-03-10: qty 20

## 2024-03-10 MED ORDER — OXYCODONE HCL 5 MG/5ML PO SOLN: 5.0000 mg | Freq: Once | ORAL | Status: DC | PRN

## 2024-03-10 MED ORDER — LIDOCAINE HCL (PF) 2 % IJ SOLN
INTRAMUSCULAR | Status: AC
  Filled 2024-03-10: qty 5

## 2024-03-10 MED ORDER — CEFAZOLIN SODIUM-DEXTROSE 2-4 GM/100ML-% IV SOLN
INTRAVENOUS | Status: AC
Start: 2024-03-10 — End: ?
  Filled 2024-03-10: qty 100

## 2024-03-10 MED ORDER — STERILE WATER FOR IRRIGATION IR SOLN
Status: DC | PRN
  Administered 2024-03-10: 3000 mL

## 2024-03-10 MED ORDER — OXYCODONE HCL 5 MG PO TABS: 5.0000 mg | ORAL_TABLET | Freq: Once | ORAL | Status: DC | PRN

## 2024-03-10 MED ORDER — FENTANYL CITRATE (PF) 100 MCG/2ML IJ SOLN: 25.0000 ug | INTRAMUSCULAR | Status: DC | PRN

## 2024-03-10 MED ORDER — DEXAMETHASONE SODIUM PHOSPHATE 10 MG/ML IJ SOLN
INTRAMUSCULAR | Status: DC | PRN
  Administered 2024-03-10: 10 mg via INTRAVENOUS

## 2024-03-10 MED ORDER — DEXAMETHASONE SODIUM PHOSPHATE 10 MG/ML IJ SOLN
INTRAMUSCULAR | Status: AC
Start: 2024-03-10 — End: ?
  Filled 2024-03-10: qty 1

## 2024-03-10 MED ORDER — FENTANYL CITRATE (PF) 100 MCG/2ML IJ SOLN
INTRAMUSCULAR | Status: DC | PRN
  Administered 2024-03-10: 25 ug via INTRAVENOUS
  Administered 2024-03-10: 50 ug via INTRAVENOUS
  Administered 2024-03-10: 25 ug via INTRAVENOUS

## 2024-03-10 SURGICAL SUPPLY — 19 items
BAG URINE DRAIN 2000ML AR STRL (UROLOGICAL SUPPLIES) ×1 IMPLANT
BALLOON OPTILUME DCB 24X5X75 (BALLOONS) IMPLANT
BALLOON OPTILUME DCB 30X3X75 (BALLOONS) IMPLANT
BALLOON OPTILUME DCB 30X5X75 (BALLOONS) IMPLANT
CATH FOL 2WAY LX 16X5 (CATHETERS) IMPLANT
CATH FOL 2WAY LX 18X30 (CATHETERS) IMPLANT
CATH FOLEY 2W COUNCIL 5CC 18FR (CATHETERS) IMPLANT
CATH URETHRAL DIL 7.0X29 (CATHETERS) ×1 IMPLANT
DEVICE INFLATION ATRION QL4015 (MISCELLANEOUS) ×1 IMPLANT
ELECTRODE REM PT RTRN 9FT ADLT (ELECTROSURGICAL) IMPLANT
GLOVE BIO SURGEON STRL SZ 6.5 (GLOVE) ×1 IMPLANT
GOWN STRL REUS W/ TWL LRG LVL3 (GOWN DISPOSABLE) ×2 IMPLANT
GUIDEWIRE STR DUAL SENSOR (WIRE) ×1 IMPLANT
PACK CYSTO AR (MISCELLANEOUS) ×1 IMPLANT
SET CYSTO W/LG BORE CLAMP LF (SET/KITS/TRAYS/PACK) ×1 IMPLANT
SYR 10ML LL (SYRINGE) ×1 IMPLANT
SYR 30ML LL (SYRINGE) ×1 IMPLANT
WATER STERILE IRR 3000ML UROMA (IV SOLUTION) ×1 IMPLANT
WATER STERILE IRR 500ML POUR (IV SOLUTION) ×1 IMPLANT

## 2024-03-10 NOTE — Anesthesia Preprocedure Evaluation (Signed)
 Anesthesia Evaluation  Patient identified by MRN, date of birth, ID band Patient awake    Reviewed: Allergy & Precautions, NPO status , Patient's Chart, lab work & pertinent test results  Airway Mallampati: III  TM Distance: >3 FB Neck ROM: full    Dental  (+) Upper Dentures, Lower Dentures   Pulmonary neg pulmonary ROS, former smoker   Pulmonary exam normal        Cardiovascular hypertension, negative cardio ROS Normal cardiovascular exam     Neuro/Psych negative neurological ROS  negative psych ROS   GI/Hepatic negative GI ROS, Neg liver ROS,neg GERD  ,,  Endo/Other  negative endocrine ROS    Renal/GU negative Renal ROS  negative genitourinary   Musculoskeletal   Abdominal   Peds  Hematology negative hematology ROS (+)   Anesthesia Other Findings Past Medical History: No date: Anxiety No date: Arthritis No date: Asthma No date: Depression     Comment:  PTSD-- MEDICATIONS HELP- AND HIS DOG HELPS; [facility]               [facility] [place]. HAS SOME               MEMORY PROBLEMS WHICH PT AND HIS WIFE THINK MAY BE               RELATED TO PTSD No date: Diverticulosis No date: History of colon polyps     Comment:  ADENOMATOUS No date: Hx of radiation therapy     Comment:  prostate No date: Hypertension No date: Hypothyroidism No date: MRSA nasal colonization No date: Neuropathy of both feet No date: OA (osteoarthritis) of knee     Comment:  RIGHT No date: OSA on CPAP No date: Parkinson disease (HCC) No date: Preglaucoma, unspecified, bilateral No date: Prostate cancer (HCC)     Comment:  [facility] [place]  No date: PTSD (post-traumatic stress disorder) No date: Right knee meniscal tear  Past Surgical History: 08/21/2013: CHONDROPLASTY; Right     Comment:  Procedure:  MEDIAL PETELLAR CHONDROPLASTY;  Surgeon:               Bevin Bucks, MD;   Location: [facility];  Service: Orthopedics;  Laterality: Right; No date: COLONOSCOPY W/ POLYPECTOMY 03/01/2023: CYSTOSCOPY WITH URETHRAL DILATATION; N/A     Comment:  Procedure: CYSTOSCOPY WITH URETHRAL DILATATION,  BALLOON              DILATION USING OPTILUME;  Surgeon: Dustin Gimenez, MD;                Location: [facility];  Service: Urology;  Laterality: N/A; 10/03/2007: HAND SURGERY; Left 06-03-2004  &  07-12-2006: KNEE ARTHROSCOPY; Left     Comment:  DEBRIDEMENT OF MENISCUS, PARTIAL ACL TEAR AND               CHONDROMALACIA 08/21/2013: KNEE ARTHROSCOPY WITH LATERAL MENISECTOMY; Right     Comment:  Procedure: KNEE ARTHROSCOPY WITH LATERAL MENISECTOMY;                Surgeon: Bevin Bucks, MD;  [facility];  Service: Orthopedics;  Laterality:               Right; 08/21/2013: KNEE ARTHROSCOPY WITH MEDIAL MENISECTOMY; Right  Comment:  Procedure: KNEE ARTHROSCOPY WITH MEDIAL MENISECTOMY;                Surgeon: Bevin Bucks, MD;  Location: [facility];  Service: Orthopedics;  Laterality:               Right; 10/02/1974: MANDIBLE RECONSTRUCTION 01/19/2014: ORIF PATELLA; Left     Comment:  Procedure: LEFT KNEE PARTIAL PATTELECTOMY ;  Surgeon:               Bevin Bucks, MD;  [facility]: [facility];  Service:               Orthopedics;  Laterality: Left; 05/01/2011: REVISION PATELLA OF LEFT TOTAL KNEE 11/12/2006: TOTAL KNEE ARTHROPLASTY; Left     Comment:  AND 02-05-2007  CLOSED MANIPULATION LEFT KNEE 08/03/2020: TOTAL KNEE ARTHROPLASTY; Right     Comment:  Procedure: TOTAL KNEE ARTHROPLASTY;  Surgeon: Claiborne Crew, MD;  Location: [facility];  Service: Orthopedics;                Laterality: Right;  70 mins 01/06/2013: TOTAL KNEE REVISION; Left     Comment:  Procedure: PATELLA COMPONENT REVISION / removal and               replacement of patella component ;  Surgeon: Bevin Bucks, MD;  Location: [facility];  Service: Orthopedics;                Laterality: Left;  pt. also has a femoral nerve block in               left leg.  BMI    Body Mass Index: 29.65 kg/m      Reproductive/Obstetrics negative OB ROS                             Anesthesia Physical Anesthesia Plan  ASA: 3  Anesthesia Plan: General   Post-op Pain Management:    Induction: Intravenous  PONV Risk Score and Plan: Propofol  infusion and TIVA  Airway Management Planned: Natural Airway and Nasal Cannula  Additional Equipment:   Intra-op Plan:   Post-operative Plan:   Informed Consent: I have reviewed the patients History and Physical, chart, labs and discussed the procedure including the risks, benefits and alternatives for the proposed anesthesia with the patient or authorized representative who has indicated his/her understanding and acceptance.     Dental Advisory Given  Plan Discussed with: Anesthesiologist, CRNA and Surgeon  Anesthesia Plan Comments: (Patient consented for risks of anesthesia including but not limited to:  - adverse reactions to medications - risk of airway placement if required - damage to eyes, teeth, lips or other oral mucosa - nerve damage due to positioning  - sore throat or hoarseness - Damage to heart, brain, nerves, lungs, other parts of body or loss of life  Patient voiced understanding and assent.)       Anesthesia Quick Evaluation

## 2024-03-10 NOTE — Op Note (Signed)
 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  Mildly trabeculated bladder with a widemouth diverticulum at dome.  Bilobar coaptation without a distinct median lobe or bladder neck elevation.  Sequela of radiation noted at bladder neck area, hypervascular dilated blood vessels.  EBL: Minimal  Specimens: None  Drains: 20 French council tip Foley catheter  Indication: Jerry Mathews is a 78 y.o. patient with obstructive urinary symptoms found to have a bulbar urethral stricture on cystoscopy.  After reviewing the management options for treatment, he elected to proceed with the above surgical procedure(s). We have discussed the potential benefits and risks of the procedure, side effects of the proposed treatment, the likelihood of the patient achieving the goals of the procedure, and any potential problems that might occur during the procedure or recuperation. Informed consent has been obtained.  Description of procedure:  The patient was taken to the operating room and MAC was given.  The patient was placed in the dorsal lithotomy position, prepped and draped in the usual sterile fashion, and preoperative antibiotics were administered. A preoperative time-out was performed.   A 21 French scope was advanced per urethra into the bladder.  Upon approaching the bulbar urethra, he had a stricture approximately 8 French in diameter just distal to his urinary sphincter.  This time, the stricture was slightly more open than previous and appeared to be short such that I could see  the other side.  As such, I did end up gently advancing the scope over the open-ended through the strictured area into the membranous/prostatic urethra.  He had bilobar coaptation of the prostate but no elevated bladder neck.  Upon entering to the bladder, the bladder was inspected.  It was free of any tumors masses lesions or stones.  He did have a widemouth diverticulum at the dome.  He had mild trabeculation and some changes along the bladder neck consistent with his previous radiation (dilated blood vessels which are relatively hypervascular).  The trigone was normal with clear reflux of both UOs.  A wire was then placed through the scope into the bladder.  I then packed the scope back through the urethra inspecting along the way.  There were no other areas of concern within the urethra.  I left a sensor wire in place.  I then advanced a Optilume balloon, cystoscopy 5 cm in length, 30 French in diameter across the bulbar urethral stricture.  I inflated the balloon to 10 atm of pressure.  I allowed it to dwell for 5 minutes time.  At the end of the procedure, the balloon was gently removed and discarded.  I advanced an 67 Fr [place] council tip catheter over the wire into the bladder.  The balloon was filled and the wire was removed.  The patient was then cleaned and dried, repositioned in supine position, reversed with seizure, taken the PACU in stable condition.  Plan: Given his recurrent stricture, we will leave the catheter in place for 2 days and teach him how to self cath to keep his urethra patent at that follow-up.  Dustin Gimenez, M.D.

## 2024-03-10 NOTE — Anesthesia Procedure Notes (Signed)
 Procedure Name: MAC Date/Time: 03/10/2024 2:40 PM  Performed by: Marisue Side, CRNAPre-anesthesia Checklist: Patient identified, Emergency Drugs available, Suction available and Patient being monitored Patient Re-evaluated:Patient Re-evaluated prior to induction Oxygen  Delivery Method: Simple face mask Preoxygenation: Pre-oxygenation with 100% oxygen  Induction Type: IV induction

## 2024-03-10 NOTE — Transfer of Care (Signed)
 Immediate Anesthesia Transfer of Care Note  Patient: Jerry Mathews  Procedure(s) Performed: CYSTOSCOPY, WITH URETHRAL DILATION (Urethra)  Patient Location: PACU  Anesthesia Type:General  Level of Consciousness: drowsy and patient cooperative  Airway & Oxygen  Therapy: Patient Spontanous Breathing and Patient connected to face mask oxygen   Post-op Assessment: Report given to RN and Post -op Vital signs reviewed and stable  Post vital signs: Reviewed and stable  Last Vitals:  Vitals Value Taken Time  BP 118/72 03/10/24 1504  Temp    Pulse 51 03/10/24 1506  Resp 14 03/10/24 1506  SpO2 100 % 03/10/24 1506  Vitals shown include unfiled device data.  Last Pain:  Vitals:   03/10/24 1109  PainSc: 0-No pain         Complications: No notable events documented.

## 2024-03-10 NOTE — H&P (Signed)
 03/10/24  RRR CTAB    CC:     Chief Complaint  Patient presents with   Cysto      HPI: 78 year old male with history of prostate cancer status post IMRT and bulbar urethral stricture who presents today with recurrent obstructive urinary symptoms.  He is worried that his stricture is back.  Please see previous notes for details.  He underwent Optilume balloon urethral dilation in May 2024.   UA today is negative.   Blood pressure (!) 144/70, pulse 65. NED. A&Ox3.   No respiratory distress   Abd soft, NT, ND Normal phallus with bilateral descended testicles   Cystoscopy Procedure Note   Patient identification was confirmed, informed consent was obtained, and patient was prepped using Betadine  solution.  Lidocaine  jelly was administered per urethral meatus.       Pre-Procedure: - Inspection reveals a normal caliber ureteral meatus.   Procedure: The flexible cystoscope was introduced without difficulty - Scope was able to be advanced to the level of the bulbar urethra where there was approximately 10 French stricture.  This did not appear particularly dense or long.  There are also some concentric narrowed areas in the proximal pendulous urethra but easily traversable with a 18 French flexible scope.     Post-Procedure: - Patient tolerated the procedure well   Assessment/ Plan:   1. Incomplete bladder emptying (Primary) Possibly secondary to the 2   2. Postprocedural membranous urethral stricture/ Recurrent bulbar urethral stricture, symptomatic   I have offered him repeat Optilume balloon dilation as he did well with this in the past.  We discussed today learning how to self cath after the procedure in order to keep his urethra patent.  He is open to this.  Plan to keep his catheter for 1 to 2 days postop.   Risk benefits were reviewed again in detail including risk of bleeding, infection, recurrent stricture which is relatively high, damage to surrounding structures  amongst others.  All questions were answered. - Urinalysis, Complete - CULTURE, URINE COMPREHENSIVE   3. Pre-op testing - CULTURE, URINE COMPREHENSIVE   4.  History of prostate cancer - No PSA in our system since 2022, likely being followed at the [place].  Will clarify this at follow-up.     Dustin Gimenez, MD        Electronically signed by Dustin Gimenez, MD at 01/29/2024 10:20 AM

## 2024-03-11 ENCOUNTER — Encounter: Payer: Self-pay | Admitting: Urology

## 2024-03-11 NOTE — Anesthesia Postprocedure Evaluation (Signed)
 Anesthesia Post Note  Patient: Jerry Mathews  Procedure(s) Performed: CYSTOSCOPY, WITH URETHRAL DILATION (Urethra)  Patient location during evaluation: PACU Anesthesia Type: General Level of consciousness: awake and alert Pain management: pain level controlled Vital Signs Assessment: post-procedure vital signs reviewed and stable Respiratory status: spontaneous breathing, nonlabored ventilation, respiratory function stable and patient connected to nasal cannula oxygen  Cardiovascular status: blood pressure returned to baseline and stable Postop Assessment: no apparent nausea or vomiting Anesthetic complications: no   No notable events documented.   Last Vitals:  Vitals:   03/10/24 1545 03/10/24 1601  BP: 136/81 (!) 138/90  Pulse: (!) 56 (!) 55  Resp: (!) 21 15  Temp: (!) 36 C (!) 36.2 C  SpO2: 95% 98%    Last Pain:  Vitals:   03/10/24 1601  TempSrc: Temporal  PainSc: 0-No pain                 Portia Brittle Krysten Veronica

## 2024-03-12 ENCOUNTER — Encounter: Payer: Self-pay | Admitting: Physician Assistant

## 2024-03-12 ENCOUNTER — Ambulatory Visit (INDEPENDENT_AMBULATORY_CARE_PROVIDER_SITE_OTHER): Admitting: Physician Assistant

## 2024-03-12 VITALS — BP 132/69 | HR 57 | Ht 77.0 in | Wt 250.0 lb

## 2024-03-12 DIAGNOSIS — N99112 Postprocedural membranous urethral stricture: Secondary | ICD-10-CM | POA: Diagnosis not present

## 2024-03-12 NOTE — Patient Instructions (Addendum)
 Please start self-catheterization once every other day to keep your urethra open, first catheter attempt should be on Friday, 03/14/2024.  I'm giving you samples of catheters today to last you a while. Please reach out to your [facility] urologist to request a supply order of 16 French intermittent catheters, 15 per month, lifetime duration. For reference, the specific catheter I gave you today is a 16 [employer]. You may prep your penis and hands with warm, soapy water , or purchase castile soap wipes to clean your penis, whichever you prefer.     Step 1 Get all of your supplies ready and place them near you. Step 2 Wash your hands with warm, soapy water  or put on gloves. Step 3 Wash around the tip of your penis with warm, soapy water  or a castille soap towelette. Step 4 Take catheter out of package and drain the lubricant over toilet. Step 5 Hold the penis at a 45 degree angle from the stomach in one hand and the catheter in the other hand. Step 6 Insert the catheter slowly into your urethra. If there is resistance when the catheter reaches the sphincter muscle,              take a deep breath and gently apply steady pressure.              DO NOT FORCE THE CATHETER Step 7 When the urine begins to flow, insert another inch and lower penis. Allow the urine to flow into the toilet. Step 8 When the flow of urine stops, slowly remove the catheter.

## 2024-03-12 NOTE — Progress Notes (Signed)
 Catheter Removal  Patient is present today for a catheter removal.  8ml of water  was drained from the balloon. A 20FR foley cath was removed from the bladder, no complications were noted. Patient tolerated well.  Performed by: Demarkis Gheen, PA-C   Continuous Intermittent Catheterization  Due to urethral stricture patient is present today for a teaching of self I & O Catheterization. Patient was given detailed verbal and printed instructions of self catheterization. He declined to practice today. I instructed him to insert a Cytogeneticist Standard catheter every other day indefinitely to keep the urethra patent. We discussed how to troubleshoot difficulty with insertion and return precautions including the inability to pass the catheter and difficulty urinating.  Patient was given a sample bag with supplies to take home.  He is a [facility] patient and wants to get supplies through them. I'm giving him info in his AVS today on what supplies he will need and have given him enough sample catheters to last him.  Performed by: Taysha Majewski, PA-C   Follow up: Return in about 3 months (around 06/12/2024) for Postop follow up and symptom recheck.

## 2024-06-12 ENCOUNTER — Ambulatory Visit: Admitting: Physician Assistant

## 2024-06-17 ENCOUNTER — Encounter: Payer: Self-pay | Admitting: Physician Assistant

## 2024-06-17 ENCOUNTER — Ambulatory Visit (INDEPENDENT_AMBULATORY_CARE_PROVIDER_SITE_OTHER): Admitting: Physician Assistant

## 2024-06-17 VITALS — BP 151/74 | HR 67 | Ht 77.0 in | Wt 255.0 lb

## 2024-06-17 DIAGNOSIS — N99112 Postprocedural membranous urethral stricture: Secondary | ICD-10-CM

## 2024-06-17 DIAGNOSIS — Z8546 Personal history of malignant neoplasm of prostate: Secondary | ICD-10-CM | POA: Diagnosis not present

## 2024-06-17 NOTE — Progress Notes (Signed)
 06/17/2024 5:24 PM   Jerry Mathews Apr 21, 1946 [id number]  CC: Chief Complaint  Patient presents with   Follow-up   HPI: Jerry Mathews is a 78 y.o. male with PMH prostate cancer s/p IMRT and bulbar urethral stricture s/p Optilume dilation with Dr. Penne x 2 managed with CIC who presents today for follow-up.   Today he reports he has continued to CIC maybe twice weekly.  He admits that he does not do it as often as he should.  He gets his supplies from the [facility] and they have given him coud catheters.  He states he is able to insert them without significant resistance.  He clarifies that his [facility] providers are checking his PSA routinely.  IPSS 5/mostly satisfied as below.   IPSS     Row Name 06/17/24 1400         International Prostate Symptom Score   How often have you had the sensation of not emptying your bladder? Not at All     How often have you had to urinate less than every two hours? Less than half the time     How often have you found it difficult to postpone urination? Not at All     How often have you had a weak urinary stream? Less than 1 in 5 times     How often have you had to strain to start urination? Not at All     How many times did you typically get up at night to urinate? 2 Times     Total IPSS Score 5       Quality of Life due to urinary symptoms   If you were to spend the rest of your life with your urinary condition just the way it is now how would you feel about that? Mostly Satisfied         PMH: Past Medical History:  Diagnosis Date   Anxiety    Arthritis    Asthma    Depression    PTSD-- MEDICATIONS HELP- AND HIS DOG HELPS; [facility] [place]. HAS SOME MEMORY PROBLEMS WHICH PT AND HIS WIFE THINK MAY BE RELATED TO PTSD   Diverticulosis    History of colon polyps    ADENOMATOUS   Hx of radiation therapy    prostate   Hypertension    Hypothyroidism    MRSA nasal colonization    Neuropathy of both feet     OA (osteoarthritis) of knee    RIGHT   OSA on CPAP    Parkinson disease (HCC)    Preglaucoma, unspecified, bilateral    Prostate cancer (HCC)    [facility] [place]    PTSD (post-traumatic stress disorder)    Right knee meniscal tear     Surgical History: Past Surgical History:  Procedure Laterality Date   CHONDROPLASTY Right 08/21/2013   Procedure:  MEDIAL PETELLAR CHONDROPLASTY;  Surgeon: Donnice JONETTA Car, MD;  Location: [facility];  Service: Orthopedics;  Laterality: Right;   COLONOSCOPY W/ POLYPECTOMY     CYSTOSCOPY WITH URETHRAL DILATATION N/A 03/01/2023   Procedure: CYSTOSCOPY WITH URETHRAL DILATATION,  BALLOON DILATION USING OPTILUME;  Surgeon: Penne Knee, MD;  Location: [facility];  Service: Urology;  Laterality: N/A;   CYSTOSCOPY WITH URETHRAL DILATATION N/A 03/10/2024   Procedure: CYSTOSCOPY, WITH URETHRAL DILATION;  Surgeon: Penne Knee, MD;  Location: [facility];  Service: Urology;  Laterality: N/A;  USING OPTILUME   HAND SURGERY  Left 10/03/2007   KNEE ARTHROSCOPY Left 06-03-2004  &  07-12-2006   DEBRIDEMENT OF MENISCUS, PARTIAL ACL TEAR AND CHONDROMALACIA   KNEE ARTHROSCOPY WITH LATERAL MENISECTOMY Right 08/21/2013   Procedure: KNEE ARTHROSCOPY WITH LATERAL MENISECTOMY;  Surgeon: Donnice JONETTA Car, MD;  Location[facility];  Service: Orthopedics;  Laterality: Right;   KNEE ARTHROSCOPY WITH MEDIAL MENISECTOMY Right 08/21/2013   Procedure: KNEE ARTHROSCOPY WITH MEDIAL MENISECTOMY;  Surgeon: Donnice JONETTA Car, MD;  Location: [facility];  Service: Orthopedics;  Laterality: Right;   MANDIBLE RECONSTRUCTION  10/02/1974   ORIF PATELLA Left 01/19/2014   Procedure: LEFT KNEE PARTIAL PATTELECTOMY ;  Surgeon: Donnice JONETTA Car, MD;  Location: [facility];  Service: Orthopedics;  Laterality: Left;   REVISION PATELLA OF LEFT TOTAL KNEE  05/01/2011   TOTAL KNEE ARTHROPLASTY Left 11/12/2006   AND 02-05-2007  CLOSED  MANIPULATION LEFT KNEE   TOTAL KNEE ARTHROPLASTY Right 08/03/2020   Procedure: TOTAL KNEE ARTHROPLASTY;  Surgeon: Car Donnice, MD;  Location: [facility];  Service: Orthopedics;  Laterality: Right;  70 mins   TOTAL KNEE REVISION Left 01/06/2013   Procedure: PATELLA COMPONENT REVISION / removal and replacement of patella component ;  Surgeon: Donnice JONETTA Car, MD;  Location: [facility];  Service: Orthopedics;  Laterality: Left;  pt. also has a femoral nerve block in left leg.    Home Medications:  Allergies as of 06/17/2024   No Known Allergies      Medication List        Accurate as of June 17, 2024  5:24 PM. If you have any questions, ask your nurse or doctor.          albuterol  108 (90 Base) MCG/ACT inhaler Commonly known as: VENTOLIN  HFA Inhale 2 puffs into the lungs every 6 (six) hours as needed for wheezing or shortness of breath.   buPROPion  300 MG 24 hr tablet Commonly known as: WELLBUTRIN  XL Take 300 mg by mouth at bedtime.   busPIRone 15 MG tablet Commonly known as: BUSPAR Take 30 mg by mouth 2 (two) times daily.   carbidopa -levodopa  25-100 MG tablet Commonly known as: SINEMET  IR Take 1 tablet by mouth 3 (three) times daily.   cetirizine 10 MG tablet Commonly known as: ZYRTEC Take 10 mg by mouth daily.   finasteride 5 MG tablet Commonly known as: PROSCAR Take 1 tablet by mouth daily.   fluticasone -salmeterol 500-50 MCG/ACT Aepb Commonly known as: ADVAIR Take 1 puff by mouth in the morning and at bedtime.   lamoTRIgine  200 MG tablet Commonly known as: LAMICTAL  Take 200 mg by mouth at bedtime.   levothyroxine  137 MCG tablet Commonly known as: SYNTHROID  Take 137 mcg by mouth daily before breakfast.   Lifitegrast  5 % Soln Place 1 drop into both eyes 2 (two) times daily.   montelukast  10 MG tablet Commonly known as: SINGULAIR  Take 10 mg by mouth at bedtime.   Spiriva  Respimat 2.5 MCG/ACT Aers Generic drug: Tiotropium Bromide  Monohydrate Inhale 2  puffs into the lungs daily.   tamsulosin  0.4 MG Caps capsule Commonly known as: FLOMAX  Take 0.8 mg by mouth at bedtime.   vitamin B-12 500 MCG tablet Commonly known as: CYANOCOBALAMIN Take 500 mcg by mouth daily.        Allergies:  No Known Allergies  Family History: Family History  Problem Relation Age of Onset   Heart disease Mother    Colon cancer Neg Hx    Esophageal cancer Neg Hx  Rectal cancer Neg Hx    Stomach cancer Neg Hx     Social History:   reports that he quit smoking about 30 years ago. His smoking use included cigarettes. He started smoking about 55 years ago. He has a 25 pack-year smoking history. He has never used smokeless tobacco. He reports current alcohol  use. He reports that he does not use drugs.  Physical Exam: BP (!) 151/74 (BP Location: Left Arm, Patient Position: Sitting, Cuff Size: Normal)   Pulse 67   Ht 6' 5 (1.956 m)   Wt 255 lb (115.7 kg)   SpO2 95%   BMI 30.24 kg/m   Constitutional:  Alert and oriented, no acute distress, nontoxic appearing HEENT: [place], AT Cardiovascular: No clubbing, cyanosis, or edema Respiratory: Normal respiratory effort, no increased work of breathing Skin: No rashes, bruises or suspicious lesions Neurologic: Grossly intact, no focal deficits, moving all 4 extremities Psychiatric: Normal mood and affect  Assessment & Plan:   1. Postprocedural membranous urethral stricture (Primary) Continue CIC twice weekly, contact us  if he develops difficulty inserting the catheters concerning for recurrence.  2. History of prostate cancer Continue with PSA monitoring per [facility].  Return in about 1 year (around 06/17/2025) for IPSS, PVR.  Lucie Hones, PA-C  [facility] [address] [place], [place] [address] [phone]

## 2025-06-17 ENCOUNTER — Ambulatory Visit: Admitting: Physician Assistant
